# Patient Record
Sex: Male | Born: 1980 | Race: Black or African American | Hispanic: No | Marital: Single | Smoking: Current every day smoker
Health system: Southern US, Community
[De-identification: ages and names within clinical notes are randomized; demographics above are authoritative.]

## PROBLEM LIST (undated history)

## (undated) HISTORY — PX: APPENDECTOMY: SHX54

---

## 2003-11-22 ENCOUNTER — Emergency Department (HOSPITAL_COMMUNITY): Admission: EM | Admit: 2003-11-22 | Discharge: 2003-11-22 | Payer: Self-pay | Admitting: Emergency Medicine

## 2004-10-28 ENCOUNTER — Emergency Department (HOSPITAL_COMMUNITY): Admission: EM | Admit: 2004-10-28 | Discharge: 2004-10-28 | Payer: Self-pay | Admitting: Emergency Medicine

## 2005-12-04 ENCOUNTER — Emergency Department (HOSPITAL_COMMUNITY): Admission: EM | Admit: 2005-12-04 | Discharge: 2005-12-04 | Payer: Self-pay | Admitting: Emergency Medicine

## 2006-05-28 ENCOUNTER — Observation Stay (HOSPITAL_COMMUNITY): Admission: EM | Admit: 2006-05-28 | Discharge: 2006-05-28 | Payer: Self-pay | Admitting: Emergency Medicine

## 2006-08-16 ENCOUNTER — Inpatient Hospital Stay (HOSPITAL_COMMUNITY): Admission: EM | Admit: 2006-08-16 | Discharge: 2006-08-20 | Payer: Self-pay | Admitting: Emergency Medicine

## 2006-11-01 ENCOUNTER — Emergency Department (HOSPITAL_COMMUNITY): Admission: EM | Admit: 2006-11-01 | Discharge: 2006-11-01 | Payer: Self-pay | Admitting: Emergency Medicine

## 2006-12-28 ENCOUNTER — Inpatient Hospital Stay (HOSPITAL_COMMUNITY): Admission: EM | Admit: 2006-12-28 | Discharge: 2007-01-01 | Payer: Self-pay | Admitting: Emergency Medicine

## 2007-03-28 ENCOUNTER — Emergency Department (HOSPITAL_COMMUNITY): Admission: EM | Admit: 2007-03-28 | Discharge: 2007-03-29 | Payer: Self-pay | Admitting: Emergency Medicine

## 2009-07-20 ENCOUNTER — Ambulatory Visit: Payer: Self-pay | Admitting: Cardiothoracic Surgery

## 2009-07-20 ENCOUNTER — Inpatient Hospital Stay (HOSPITAL_COMMUNITY): Admission: EM | Admit: 2009-07-20 | Discharge: 2009-07-24 | Payer: Self-pay | Admitting: Cardiothoracic Surgery

## 2009-07-20 ENCOUNTER — Encounter: Payer: Self-pay | Admitting: Emergency Medicine

## 2010-10-26 ENCOUNTER — Encounter: Payer: Self-pay | Admitting: Cardiothoracic Surgery

## 2011-01-08 LAB — DIFFERENTIAL
Basophils Relative: 1 % (ref 0–1)
Lymphs Abs: 2.2 10*3/uL (ref 0.7–4.0)
Monocytes Relative: 6 % (ref 3–12)
Neutro Abs: 4.8 10*3/uL (ref 1.7–7.7)
Neutrophils Relative %: 62 % (ref 43–77)

## 2011-01-08 LAB — BASIC METABOLIC PANEL
BUN: 13 mg/dL (ref 6–23)
Calcium: 8.8 mg/dL (ref 8.4–10.5)
Creatinine, Ser: 1.05 mg/dL (ref 0.4–1.5)
GFR calc Af Amer: >60 mL/min (ref >60)

## 2011-01-08 LAB — CBC
MCHC: 34.7 g/dL (ref 30.0–36.0)
Platelets: 202 10*3/uL (ref 150–400)
RBC: 5.03 MIL/uL (ref 4.22–5.81)
WBC: 7.8 10*3/uL (ref 4.0–10.5)

## 2011-01-08 LAB — D-DIMER, QUANTITATIVE: D-Dimer, Quant: 0.32 ug/mL-FEU (ref 0.00–0.48)
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
## 2011-02-20 NOTE — H&P (Signed)
NAME:  Oscar Acosta, Oscar Acosta NO.:  [id number]   MEDICAL RECORD NO.:  [id number]          PATIENT TYPE:  INP   LOCATION:  1319                         FACILITY:  [facility]   PHYSICIAN:  Herbie Saxon, MDDATE OF BIRTH:  November 07, 1980   DATE OF ADMISSION:  12/27/2006  DATE OF DISCHARGE:                              HISTORY & PHYSICAL   CHIEF COMPLAINT:  This is a 30 year old male with the chief complaint of  a sore throat and fever for three days.   PRIMARY CARE PHYSICIAN:  Unassigned.   HISTORY OF PRESENT ILLNESS:  This 30 year old African/American male  presenting with right neck pain, pain on swallowing with a sore throat  and high-grade fever, temperature of 101 degrees, difficulty with  swallowing and pain on swallowing.  He has also noticed right ear pain  and headache, though no neck stiffness or seizure activity.   PAST MEDICAL HISTORY:  He had a lung collapse in September 2007.   PAST SURGICAL HISTORY:  Chest tube inserted in September 2007.   FAMILY HISTORY:  Grandmother has cancer of the breast.   MEDICATIONS:  None.   ALLERGIES:  No known drug allergies.   SOCIAL HISTORY:  He smokes 1/2 pack per day.  He drinks questionable  alcohol.  He is single.  He works as an Event organiser.  He has  no children.   REVIEW OF SYSTEMS:  The total systems were reviewed and are positive as  above.   PHYSICAL EXAMINATION:  GENERAL:  He is a young man, not in acute  distress.  VITAL SIGNS:  Present temperature 98 degrees, pulse 80, respirations 20,  blood pressure 130/80.  HEENT:  Pupils equal, reactive to light and accommodation.  A frontal  swelling on the right side of his neck.  The left eardrum is  erythematous.  Right eardrum is erythematous (inflamed), with enlarged  tonsils bilaterally, right greater than left.  No reduced auditory  acuity.  CHEST:  Clinically clear.  HEART:  Sounds 1 and 2 regular.  ABDOMEN:  Soft, nontender.  NEUROLOGIC:  He  is alert and oriented x3.  EXTREMITIES:  Peripheral pulses present.  No pedal edema.   LABORATORY DATA:  WBCs 7.5,  hematocrit 30, platelet count 275, glucose  100.  Sodium 136, potassium 3.7, chloride 105, bicarbonate 26, BUN 4,  creatinine 0.8.   CT of neck shows a peritonsillar abscess.   PLAN:  The patient is to be admitted to the medical floor.  An ENT  consultation for possible I&D.  IV Unasyn 1.5 gram q.6h., IV clindamycin  600 mg q.8h., Motrin 800 mg q.8h. p.r.n., Tylenol 650 mg q.6h. p.r.n.,  IV fluid with normal saline at 250 an hour, Dilaudid 1 mg IV q.6h.  p.r.n., a clear liquid diet and advance as tolerated. Lovenox 40 mg  subcu daily, Protonix 40 mg IV daily.   CONDITION:  Fair.   PROGNOSIS:  Fair.  The patient has been explained and verbalizes  understanding.   The time of admission is 40 minutes.      Herbie Saxon, MD  Electronically Signed  MIO/MEDQ  D:  12/28/2006  T:  12/28/2006  Job:  [id number]

## 2011-02-20 NOTE — Discharge Summary (Signed)
NAME:  Oscar Acosta, Oscar Acosta NO.:  [id number]   MEDICAL RECORD NO.:  [id number]          PATIENT TYPE:  INP   LOCATION:  1319                         [facility]ACILITY[facility]  [facility]   PHYSICIAN:  Herbie Saxon, MDDATE OF BIRTH:  1981/05/03   DATE OF ADMISSION:  12/27/2006  DATE OF DISCHARGE:  01/01/2007                               DISCHARGE SUMMARY   DISCHARGE DIAGNOSIS:  Peritonsillar phlegmon.   CONSULTS:  Newman Pies, M.D., ENT.   RADIOLOGY:  The CAT scan of the neck on December 28, 2006 shows soft  tissues on the right tonsillar pillar, a 2 cm fluid collection in the  right floor of the mouth just superior to the submandibular salivary  gland, consistent with a peritonsillar abscess.   HOSPITAL COURSE:  This 30 year old African-American male presented with  right-sided neck pain, pain on swallowing, sore throat, high grade  fever, inability to open mouth properly, difficulty with swallowing and  pain on swallowing, associated right ear pain and headache.  ENT was  consulted, Dr. Suszanne Conners.  Agreed with IV antibiotic regimen Clindamycin.  Vancomycin and Avelox were later ordered.  Patient is clinically  improved.  He is afebrile, and he has been cleared for discharge by the  ENT surgeon.   DISCHARGE CONDITION:  Stable.   DIET:  Regular.   ACTIVITY:  No restriction.   Follow up with the ENT consultant, Dr. Suszanne Conners, in one week.   DISPOSITION:  Home.   MEDICATIONS ON DISCHARGE:  1. Augmentin 875 mg p.o. b.i.d. x5 days.  2. Clindamycin 600 mg p.o. t.i.d. x5 days.  3. Motrin 800 mg p.o. q.8h. p.r.n.  4. Levoxyl 500 mg p.o. t.i.d. x3 days.  5. Ambien 5 mg p.o. nightly p.r.n.  6. Vicodin 5/500 1 q.6h. p.r.n.   Patient is to follow up with the primary care of his choice in a week or  two, as he presently does not have any primary care.   PHYSICAL EXAMINATION:  GENERAL:  He is a young man in no acute distress.  VITAL SIGNS:  Temperature 98, pulse 70, respiratory rate 20,  blood  pressure 110/70.  HEENT:  Right facial swelling is much improved, minimal tenderness,  reduced trismus.  Significantly better.  He is tolerating oral feeds.  HEART:  S1 and S2.  ABDOMEN:  Benign.  He is alert and oriented x3.  EXTREMITIES:  Peripheral pulses present.  No pedal edema.  NEUROLOGIC:  Cranial nerves I-XII grossly normal.  Deep tendon reflexes  2+ generally.   LABS:  WBC is 7, hematocrit 36, platelet count 245.  Chemistry shows  sodium 113, potassium 3.9, chloride 105, bicarbonate 25, glucose 116,  BUN 5, creatinine 0.8.  Blood culture is negative so far.  Strep group A  screen was negative.   TIME:  Thirty minutes.      Herbie Saxon, MD  Electronically Signed     MIO/MEDQ  D:  01/01/2007  T:  01/01/2007  Job:  [id number]

## 2011-02-20 NOTE — Op Note (Signed)
NAME:  Oscar Acosta, Oscar Acosta NO.:  [id number]   MEDICAL RECORD NO.:  [id number]          PATIENT TYPE:  OBV   LOCATION:  2550                         FACILITY:  [facility]   PHYSICIAN:  Marcelo Baldy, DDS, MDDATE OF BIRTH:  1981-03-26   DATE OF PROCEDURE:  05/28/2006  DATE OF DISCHARGE:  05/28/2006                                 OPERATIVE REPORT   PREOPERATIVE DIAGNOSES:  1. A 3-cm complicated left upper eyelid laceration.  2. A 10-cm complicated right cheek laceration.  3. A 12-cm complicated right cheek laceration which included the vermilion      border of the lip.  4. Multiple, less than 1-cm, superficial facial lacerations.  5. Foreign body and left upper eyelid laceration.  6. A 2-cm right brow laceration.   POSTOPERATIVE DIAGNOSES:  1. A 3-cm complicated left upper eyelid laceration.  2. A 10-cm complicated right cheek laceration.  3. A 12-cm complicated right cheek laceration which included the vermilion      border of the lip.  4. Multiple less than 1-cm superficial facial lacerations.  5. Foreign body and left upper eyelid laceration.  6. A 2-cm right brow laceration.   PROCEDURES PERFORMED:  1. Irrigation and debridement of multiple facial lacerations with removal      of necrotic skin edges.  2. Removal of foreign body from left upper eyelid.  3. Complex Repair of multiple facial lacerations.   ANESTHESIA:  General endotracheal anesthesia.   COMPLICATIONS:  None.   SPECIMENS:  None.   ESTIMATED BLOOD LOSS:  25 mL   PROCEDURE IN DETAIL:  The patient was brought to the operating room, placed  on the operating room table in the supine position.  Leads and monitors were  placed by the anesthesia team in the standard fashion.  The patient was then  induced and intubated.  Bilateral breath sounds and end tidal CO2 confirmed  accurate tube placement.  The patient's face was then prepped and draped in  the appropriate sterile fashion.   Attention  was first turned to the cheek lacerations both of which were pulse  vac'd with approximately 250 mL of saline to clean the wounds.  Bipolar  cautery was used for hemostasis.  The lacerations were noted to extend  through the skin and muscle of the cheek.  The inferior cheek laceration  which is approximately 12 cm in length extended through the vermilion border  of the lower lip near the commissure.  This laceration also extended through  the skin and the muscle did not penetrate the periosteum overlying the  mandible.  The other lacerations of the face were irrigated gently with bulb  irrigation.  Several pieces of glass were removed from the left upper eyelid  laceration.  The right and left eyes were also gently irrigated using  balanced salt solution, and again small pieces of glass were removed from  the fornix of the left eye.  After all of the lacerations were thoroughly  irrigated, necrotic skin edges were then debrided and the wound margins were  freshened to allow for excellent approximation at the  time of closure.   Attention was first turned to reapproximating the vermilion border of the  lip.  This was done by placing a 5-0 Monocryl in the muscle layer and then a  5-0 plain gut suture in the mucosal layer.  The remainder of that laceration  was closed in layers using 4-0 Monocryl in an interrupted fashion in the  deep layers and 6-0 nylon in a running baseball fashion in the skin.  The  second right cheek laceration, after hemostasis was obtained with bipolar  cautery, was also closed in a similar fashion using 4-0 Monocryl in an  interrupted fashion in the deep layers and 6-0 nylon in a running baseball  fashion in the skin.  The right brow laceration edges were freshened and  necrotic skin was debrided.  The skin was undermined in the subcutaneous  plane to allow for a passive closure.  The deep layers were then closed with  4-0 Monocryl in an interrupted fashion.  The skin  was closed with 6-0 nylon  in a running baseball fashion.  Prior to closure of the right cheek  lacerations, the skin was also slightly undermined approximately 1 cm in all  directions to allow for a tension-free closure.   The left upper eyelid, after thorough irrigation and debridement, removal of  foreign bodies was a very jagged laceration, so the edges were freshened to  create smooth contours of the skin.  The laceration was then closed using 6-  0 nylon in an interrupted and running baseball fashion.   The other multiple superficial lacerations of the face were closed in single  layer using 6-0 nylon and 6-0 Prolene in an interrupted fashion.  After all  the lacerations were closed, the face was gently cleansed.  The lacerations  were then dressed with bacitracin ointment.  Patient at this point was  allowed to awaken from anesthesia, was extubated, and was transported to the  recovery room in stable condition.  The patient tolerated the procedure  well.  At the end of the procedure, all needle and sponge counts were  correct x2.  There were no complications for the procedure.      Marcelo Baldy, DDS, MD  Electronically Signed     TGB/MEDQ  D:  05/30/2006  T:  05/31/2006  Job:  [id number]

## 2011-02-20 NOTE — Discharge Summary (Signed)
NAME:  Oscar Acosta, Oscar Acosta NO.:  [id number]   MEDICAL RECORD NO.:  [id number]          PATIENT TYPE:  INP   LOCATION:  2007                         FACILITY:  [facility]   PHYSICIAN:  Kerin Perna, M.D.  DATE OF BIRTH:  01-07-1981   DATE OF ADMISSION:  08/15/2006  DATE OF DISCHARGE:  08/20/2006                                 DISCHARGE SUMMARY   DISCHARGE DATE:  Tentatively 1 to 2 days.   ADMISSION DIAGNOSIS:  Spontaneous right pneumothorax.   DISCHARGE DIAGNOSIS:  Right spontaneous pneumothorax, status post chest tube  placement.   CONSULTS:  1. August 16, 2006, Dr. Kathlee Nations Trigt was consulted for cardiothoracic      surgery.  2. August 16, 2006, tobacco cessation was consulted.   PROCEDURES:  August 16, 2006, patient underwent a right chest tube  (pigtail catheter) by radiology.   HISTORY OF PRESENT ILLNESS:  This is [name] who  was in his usual state of health until 10 p.m. on August 15, 2006 when he  developed a sudden onset of right-sided chest pain after getting up from his  chair after working on his computer.  The pain was constant and associated  with shortness of breath.  The pain was sharp.  Due to his chest pain and  shortness of breath, he presented to the [facility] Emergency  Department.  Please see dictated history and physical for further details.   HOSPITAL COURSE:  Upon presentation to the emergency department, a chest x-  ray was done, which showed a complete right pneumothorax.  The patient also  had cardiac enzymes and D-dimer, which were negative.  Chest x-ray showed  right spontaneous pneumothorax, first occurrence.  Dr. Donata Clay was  consulted; however, he was in the operating room and, therefore, a right  chest tube, pigtail catheter, was placed by radiology.  Followup chest x-ray  showed reexpansion in the lungs with residual 5 to 10% right pneumothorax.  Patient's pleural evac was  placed to suction and it was monitored with daily  chest x-rays.  Tobacco cessation spoke with the patient on August 16, 2006.   Patient did have a small air leak present after chest tube placement.  The  plan was to wait for 1 to 2 days to see if his lung reexpanded or he may  need VATS.  August 17, 2006, the patient's lung dropped with a decrease in  suction.  CT scan was scheduled for August 18, 2006.  Patient was then  again put on wall suction at 20 cm without an air leak at 1500 on August 17, 2006.  Repeat chest x-ray revealed lung reexpansion at 90%.  On August 18, 2006, patient was put on water seal and followup chest x-ray showed no  significant change, less than 5% pneumothorax.  August 19, 2006, patient's  chest x-ray was stable, left tiny pneumothorax and his chest tube was DC.  Followup chest x-ray is still pending.  Patient did not have an air leak on  November 14 and August 19, 2006.  His chest tube had minimal drainage.  Patient was mobilizing in the halls with a steady gait and doing his  incentive spirometry as directed.  The patient had IV morphine and Tylox  p.r.n. for pain control.   DISCHARGE DISPOSITION:  Patient will be discharged home in the next 1 to 2  days provided he remains hemodynamically stable, his pain is controlled and  his chest x-rays are stable.   MEDICATIONS:  Include Tylox one to two tabs every 4 hours p.r.n.   INSTRUCTIONS:  Patient instructed to follow with a regular diet.  No driving  or heavy lifting greater than 10 pounds for 2 weeks.  The patient _________  and to increase activity as tolerated.  He is continue his breathing  exercises.  He may shower and clean his incisions with mild soap and water.  Patient is to call the office if any wound problems shall arise of his  incision, erythema, drainage, temp greater than 101.5 or an increase in  shortness of breath.   FOLLOWUP:  Patient will have a followup appointment with  Dr. Donata Clay in 2  weeks.  The office will contact him with time and date of appointment.  Prior to seeing Dr. Donata Clay, the patient will have a chest x-ray taken at  [facility].      Constance Holster, [place]      Kerin Perna, M.D.  Electronically Signed    JMW/MEDQ  D:  08/19/2006  T:  08/20/2006  Job:  [id number]

## 2011-05-11 ENCOUNTER — Encounter: Payer: Self-pay | Admitting: *Deleted

## 2011-05-11 ENCOUNTER — Emergency Department (HOSPITAL_BASED_OUTPATIENT_CLINIC_OR_DEPARTMENT_OTHER)
Admission: EM | Admit: 2011-05-11 | Discharge: 2011-05-11 | Disposition: A | Discharge status: Home / Self Care | Payer: Self-pay | Attending: Emergency Medicine | Admitting: Emergency Medicine

## 2011-05-11 DIAGNOSIS — R197 Diarrhea, unspecified: Secondary | ICD-10-CM | POA: Insufficient documentation

## 2011-05-11 DIAGNOSIS — R1110 Vomiting, unspecified: Secondary | ICD-10-CM | POA: Insufficient documentation

## 2011-05-11 DIAGNOSIS — R109 Unspecified abdominal pain: Secondary | ICD-10-CM | POA: Insufficient documentation

## 2011-05-11 LAB — URINE MICROSCOPIC-ADD ON

## 2011-05-11 LAB — COMPREHENSIVE METABOLIC PANEL WITH GFR
ALT: 87 U/L — ABNORMAL HIGH (ref 0–53)
AST: 45 U/L — ABNORMAL HIGH (ref 0–37)
Albumin: 4.3 g/dL (ref 3.5–5.2)
Alkaline Phosphatase: 90 U/L (ref 39–117)
BUN: 11 mg/dL (ref 6–23)
CO2: 24 meq/L (ref 19–32)
Calcium: 9.4 mg/dL (ref 8.4–10.5)
Chloride: 101 meq/L (ref 96–112)
Creatinine, Ser: 0.9 mg/dL (ref 0.50–1.35)
GFR calc Af Amer: >60 mL/min
GFR calc non Af Amer: >60 mL/min
Glucose, Bld: 102 mg/dL — ABNORMAL HIGH (ref 70–99)
Potassium: 3.9 meq/L (ref 3.5–5.1)
Sodium: 138 meq/L (ref 135–145)
Total Bilirubin: 0.5 mg/dL (ref 0.3–1.2)
Total Protein: 8.4 g/dL — ABNORMAL HIGH (ref 6.0–8.3)

## 2011-05-11 LAB — URINALYSIS, ROUTINE W REFLEX MICROSCOPIC
Glucose, UA: NEGATIVE mg/dL
Ketones, ur: NEGATIVE mg/dL
Leukocytes, UA: NEGATIVE
Nitrite: NEGATIVE
Protein, ur: 30 mg/dL — AB

## 2011-05-11 LAB — CBC
HCT: 44.9 % (ref 39.0–52.0)
Hemoglobin: 16.5 g/dL (ref 13.0–17.0)
MCH: 28.7 pg (ref 26.0–34.0)
MCHC: 36.7 g/dL — ABNORMAL HIGH (ref 30.0–36.0)
MCV: 78.2 fL (ref 78.0–100.0)
Platelets: 183 K/uL (ref 150–400)
RBC: 5.74 MIL/uL (ref 4.22–5.81)
RDW: 13.4 % (ref 11.5–15.5)
WBC: 8 K/uL (ref 4.0–10.5)

## 2011-05-11 LAB — DIFFERENTIAL
Basophils Absolute: 0 10*3/uL (ref 0.0–0.1)
Basophils Relative: 0 % (ref 0–1)
Lymphocytes Relative: 5 % — ABNORMAL LOW (ref 12–46)
Monocytes Absolute: 0.4 10*3/uL (ref 0.1–1.0)
Neutro Abs: 7.2 10*3/uL (ref 1.7–7.7)
Neutrophils Relative %: 90 % — ABNORMAL HIGH (ref 43–77)

## 2011-05-11 LAB — LIPASE, BLOOD: Lipase: 18 U/L (ref 11–59)

## 2011-05-11 MED ORDER — ONDANSETRON HCL 4 MG PO TABS: 4.0000 mg | ORAL_TABLET | Freq: Four times a day (QID) | ORAL | Status: AC

## 2011-05-11 MED ORDER — SODIUM CHLORIDE 0.9 % IV SOLN
999.0000 mL | Freq: Once | INTRAVENOUS | Status: AC
  Administered 2011-05-11: 1000 mL via INTRAVENOUS

## 2011-05-11 MED ORDER — ACETAMINOPHEN 500 MG PO TABS
1000.0000 mg | ORAL_TABLET | Freq: Once | ORAL | Status: AC
  Administered 2011-05-11: 1000 mg via ORAL
  Filled 2011-05-11: qty 2

## 2011-05-11 MED ORDER — ONDANSETRON HCL 4 MG/2ML IJ SOLN
4.0000 mg | Freq: Once | INTRAMUSCULAR | Status: AC
  Administered 2011-05-11: 4 mg via INTRAVENOUS
  Filled 2011-05-11: qty 2

## 2011-05-11 NOTE — ED Notes (Signed)
Woke at 3 am with fever chills vomiting and diarrhea.

## 2011-05-11 NOTE — ED Provider Notes (Signed)
History     CSN: [id number] Arrival date & time: 05/11/2011  5:46 PM  Chief Complaint  Patient presents with  . Abdominal Pain   Patient is a 30 y.o. male presenting with abdominal pain. The history is provided by the patient and the spouse.  Abdominal Pain The primary symptoms of the illness include abdominal pain, nausea, vomiting and diarrhea. The current episode started 6 to 12 hours ago. The onset of the illness was sudden. The problem has been gradually worsening.  The abdominal pain began 13 to24 hours ago. The abdominal pain has been gradually worsening since its onset. The abdominal pain is generalized. The abdominal pain is relieved by nothing. The abdominal pain is exacerbated by vomiting.  The illness is associated with eating and a recent illness. The patient has had a change in bowel habit. Additional symptoms associated with the illness include chills and back pain. Significant associated medical issues do not include PUD, GERD, gallstones or liver disease.   Patient complains of multiple episodes of vomiting and diarrhea. He has had body aches all day. Patient complains of body aches being worse on the right side of his body. Patient complains of pain in the upper right and left upper abdomen. Patient has not been able to keep down any fluids. Last episode of diarrhea was prior to arrival. History reviewed. No pertinent past medical history.  Past Surgical History  Procedure Date  . Appendectomy     No family history on file.  History  Substance Use Topics  . Smoking status: Current Everyday Smoker -- 0.5 packs/day  . Smokeless tobacco: Not on file  . Alcohol Use: Yes      Review of Systems  Constitutional: Positive for chills.  Gastrointestinal: Positive for nausea, vomiting, abdominal pain and diarrhea.  Musculoskeletal: Positive for back pain.  All other systems reviewed and are negative.    Physical Exam  BP 121/63  Pulse 85  Temp(Src) 102.2 F (39 C)  (Oral)  Resp 20  SpO2 100%  Physical Exam  Constitutional: He is oriented to person, place, and time. He appears well-developed and well-nourished.  HENT:  Head: Normocephalic and atraumatic.  Eyes: Pupils are equal, round, and reactive to light.  Neck: Normal range of motion.  Pulmonary/Chest: Effort normal.  Abdominal: Soft.  Musculoskeletal: Normal range of motion.  Neurological: He is alert and oriented to person, place, and time.  Skin: Skin is warm and dry.  Psychiatric: He has a normal mood and affect.    ED Course  Procedures  MDM  Pt given Iv fluids and zofran.  Labs normal,     Results for orders placed during the hospital encounter of 05/11/11  URINALYSIS, ROUTINE W REFLEX MICROSCOPIC      Component Value Range   Color, Urine YELLOW  YELLOW    Appearance CLEAR  CLEAR    Specific Gravity, Urine 1.033 (*) 1.005 - 1.030    pH 6.0  5.0 - 8.0    Glucose, UA NEGATIVE  NEGATIVE (mg/dL)   Hgb urine dipstick TRACE (*) NEGATIVE    Bilirubin Urine NEGATIVE  NEGATIVE    Ketones, ur NEGATIVE  NEGATIVE (mg/dL)   Protein, ur 30 (*) NEGATIVE (mg/dL)   Urobilinogen, UA 0.2  0.0 - 1.0 (mg/dL)   Nitrite NEGATIVE  NEGATIVE    Leukocytes, UA NEGATIVE  NEGATIVE   CBC      Component Value Range   WBC 8.0  4.0 - 10.5 (K/uL)   RBC 5.74  4.22 - 5.81 (MIL/uL)   Hemoglobin 16.5  13.0 - 17.0 (g/dL)   HCT 40.9  81.1 - 91.4 (%)   MCV 78.2  78.0 - 100.0 (fL)   MCH 28.7  26.0 - 34.0 (pg)   MCHC 36.7 (*) 30.0 - 36.0 (g/dL)   RDW 78.2  95.6 - 21.3 (%)   Platelets 183  150 - 400 (K/uL)  DIFFERENTIAL      Component Value Range   Neutrophils Relative 90 (*) 43 - 77 (%)   Neutro Abs 7.2  1.7 - 7.7 (K/uL)   Lymphocytes Relative 5 (*) 12 - 46 (%)   Lymphs Abs 0.4 (*) 0.7 - 4.0 (K/uL)   Monocytes Relative 5  3 - 12 (%)   Monocytes Absolute 0.4  0.1 - 1.0 (K/uL)   Eosinophils Relative 0  0 - 5 (%)   Eosinophils Absolute 0.0  0.0 - 0.7 (K/uL)   Basophils Relative 0  0 - 1 (%)   Basophils  Absolute 0.0  0.0 - 0.1 (K/uL)  COMPREHENSIVE METABOLIC PANEL      Component Value Range   Sodium 138  135 - 145 (mEq/L)   Potassium 3.9  3.5 - 5.1 (mEq/L)   Chloride 101  96 - 112 (mEq/L)   CO2 24  19 - 32 (mEq/L)   Glucose, Bld 102 (*) 70 - 99 (mg/dL)   BUN 11  6 - 23 (mg/dL)   Creatinine, Ser 0.86  0.50 - 1.35 (mg/dL)   Calcium 9.4  8.4 - 57.8 (mg/dL)   Total Protein 8.4 (*) 6.0 - 8.3 (g/dL)   Albumin 4.3  3.5 - 5.2 (g/dL)   AST 45 (*) 0 - 37 (U/L)   ALT 87 (*) 0 - 53 (U/L)   Alkaline Phosphatase 90  39 - 117 (U/L)   Total Bilirubin 0.5  0.3 - 1.2 (mg/dL)   GFR calc non Af Amer >60  >60 (mL/min)   GFR calc Af Amer >60  >60 (mL/min)  LIPASE, BLOOD      Component Value Range   Lipase 18  11 - 59 (U/L)  URINE MICROSCOPIC-ADD ON      Component Value Range   Squamous Epithelial / LPF RARE  RARE    Bacteria, UA RARE  RARE    Urine-Other MUCOUS PRESENT     No results found.   Langston Masker, [place] 05/11/11 2059  Langston Masker, [place] 05/11/11 2102

## 2011-05-12 NOTE — ED Provider Notes (Signed)
History     CSN: [id number] Arrival date & time: 05/11/2011  5:46 PM  Chief Complaint  Patient presents with  . Abdominal Pain   HPI  History reviewed. No pertinent past medical history.  Past Surgical History  Procedure Date  . Appendectomy     No family history on file.  History  Substance Use Topics  . Smoking status: Current Everyday Smoker -- 0.5 packs/day  . Smokeless tobacco: Not on file  . Alcohol Use: Yes      Review of Systems  Physical Exam  BP 121/63  Pulse 85  Temp(Src) 99.3 F (37.4 C) (Oral)  Resp 20  SpO2 100%  Physical Exam  ED Course  Procedures  Medical screening examination/treatment/procedure(s) were conducted as a shared visit with non-physician practitioner(s) and myself.  I personally evaluated the patient during the encounter.       Forbes Cellar, MD 05/12/11 [phone]

## 2011-07-22 LAB — DIFFERENTIAL
Eosinophils Absolute: 0
Eosinophils Relative: 0
Lymphocytes Relative: 12
Lymphs Abs: 1.3
Monocytes Relative: 7

## 2011-07-22 LAB — RAPID STREP SCREEN (MED CTR MEBANE ONLY): Streptococcus, Group A Screen (Direct): NEGATIVE

## 2011-07-22 LAB — CBC
HCT: 41.3
MCV: 82.3
Platelets: 191
RBC: 5.01
WBC: 10.8 — ABNORMAL HIGH

## 2013-09-06 ENCOUNTER — Encounter (HOSPITAL_BASED_OUTPATIENT_CLINIC_OR_DEPARTMENT_OTHER): Payer: Self-pay | Admitting: Emergency Medicine

## 2013-09-06 ENCOUNTER — Emergency Department (HOSPITAL_BASED_OUTPATIENT_CLINIC_OR_DEPARTMENT_OTHER)
Admission: EM | Admit: 2013-09-06 | Discharge: 2013-09-06 | Disposition: A | Discharge status: Home / Self Care | Payer: Self-pay | Attending: Emergency Medicine | Admitting: Emergency Medicine

## 2013-09-06 DIAGNOSIS — F17200 Nicotine dependence, unspecified, uncomplicated: Secondary | ICD-10-CM | POA: Insufficient documentation

## 2013-09-06 DIAGNOSIS — K089 Disorder of teeth and supporting structures, unspecified: Secondary | ICD-10-CM | POA: Insufficient documentation

## 2013-09-06 DIAGNOSIS — K0889 Other specified disorders of teeth and supporting structures: Secondary | ICD-10-CM

## 2013-09-06 MED ORDER — HYDROCODONE-ACETAMINOPHEN 5-325 MG PO TABS: 2.0000 | ORAL_TABLET | ORAL | Status: DC | PRN

## 2013-09-06 MED ORDER — AMOXICILLIN 500 MG PO CAPS: 500.0000 mg | ORAL_CAPSULE | Freq: Three times a day (TID) | ORAL | Status: AC

## 2013-09-06 NOTE — ED Provider Notes (Signed)
CSN: [id number]     Arrival date & time 09/06/13  1545 History   First MD Initiated Contact with Patient 09/06/13 1608     Chief Complaint  Patient presents with  . Dental Pain   (Consider location/radiation/quality/duration/timing/severity/associated sxs/prior Treatment) Patient is a 32 y.o. male presenting with tooth pain. The history is provided by the patient. No language interpreter was used.  Dental Pain Location:  Lower Quality:  No pain Severity:  Moderate Onset quality:  Gradual Timing:  Constant Progression:  Worsening Chronicity:  New Relieved by:  Nothing Worsened by:  Nothing tried Ineffective treatments:  None tried   History reviewed. No pertinent past medical history. Past Surgical History  Procedure Laterality Date  . Appendectomy     History reviewed. No pertinent family history. History  Substance Use Topics  . Smoking status: Current Every Day Smoker -- 0.50 packs/day    Types: Cigarettes  . Smokeless tobacco: Not on file  . Alcohol Use: No    Review of Systems  HENT: Positive for dental problem.   All other systems reviewed and are negative.    Allergies  Review of patient's allergies indicates no known allergies.  Home Medications   Current Outpatient Rx  Name  Route  Sig  Dispense  Refill  . amoxicillin (AMOXIL) 500 MG capsule   Oral   Take 1 capsule (500 mg total) by mouth 3 (three) times daily.   30 capsule   0   . HYDROcodone-acetaminophen (NORCO/VICODIN) 5-325 MG per tablet   Oral   Take 2 tablets by mouth every 4 (four) hours as needed.   20 tablet   0    BP 149/91  Pulse 76  Temp(Src) 99 F (37.2 C) (Oral)  Resp 16  Ht 6\' 2"  (1.88 m)  Wt 200 lb (90.719 kg)  BMI 25.67 kg/m2  SpO2 100% Physical Exam  Nursing note and vitals reviewed. Constitutional: He appears well-developed and well-nourished.  HENT:  Head: Normocephalic and atraumatic.  Right Ear: External ear normal.  Left Ear: External ear normal.   Mouth/Throat: Oropharynx is clear and moist.  Eyes: Pupils are equal, round, and reactive to light.  Neck: Normal range of motion.  Cardiovascular: Normal rate and regular rhythm.   Pulmonary/Chest: Effort normal and breath sounds normal.  Musculoskeletal: Normal range of motion.  Neurological: He is alert.  Skin: Skin is warm.    ED Course  Procedures (including critical care time) Labs Review Labs Reviewed - No data to display Imaging Review No results found.  EKG Interpretation   None       MDM   1. Toothache    Hydrocodone and amoxicillian,   Follow up with Dr. Lucky Cowboy dentist for evaluation    Elson Areas, PA-C 09/06/13 1630

## 2013-09-06 NOTE — ED Provider Notes (Signed)
Medical screening examination/treatment/procedure(s) were performed by non-physician practitioner and as supervising physician I was immediately available for consultation/collaboration.  EKG Interpretation   None         Wynston Romey, MD 09/06/13 2345 

## 2013-09-06 NOTE — ED Notes (Signed)
Pt c/o dental pain x 3 days.

## 2013-12-31 ENCOUNTER — Emergency Department (HOSPITAL_BASED_OUTPATIENT_CLINIC_OR_DEPARTMENT_OTHER): Payer: Self-pay

## 2013-12-31 ENCOUNTER — Emergency Department (HOSPITAL_BASED_OUTPATIENT_CLINIC_OR_DEPARTMENT_OTHER)
Admission: EM | Admit: 2013-12-31 | Discharge: 2013-12-31 | Disposition: A | Discharge status: Home / Self Care | Payer: Self-pay | Attending: Emergency Medicine | Admitting: Emergency Medicine

## 2013-12-31 ENCOUNTER — Encounter (HOSPITAL_BASED_OUTPATIENT_CLINIC_OR_DEPARTMENT_OTHER): Payer: Self-pay | Admitting: Emergency Medicine

## 2013-12-31 DIAGNOSIS — S8990XA Unspecified injury of unspecified lower leg, initial encounter: Secondary | ICD-10-CM | POA: Insufficient documentation

## 2013-12-31 DIAGNOSIS — R000 Tachycardia, unspecified: Secondary | ICD-10-CM | POA: Insufficient documentation

## 2013-12-31 DIAGNOSIS — X500XXA Overexertion from strenuous movement or load, initial encounter: Secondary | ICD-10-CM | POA: Insufficient documentation

## 2013-12-31 DIAGNOSIS — Y9239 Other specified sports and athletic area as the place of occurrence of the external cause: Secondary | ICD-10-CM | POA: Insufficient documentation

## 2013-12-31 DIAGNOSIS — S99929A Unspecified injury of unspecified foot, initial encounter: Principal | ICD-10-CM

## 2013-12-31 DIAGNOSIS — S99919A Unspecified injury of unspecified ankle, initial encounter: Principal | ICD-10-CM

## 2013-12-31 DIAGNOSIS — Y92838 Other recreation area as the place of occurrence of the external cause: Secondary | ICD-10-CM

## 2013-12-31 DIAGNOSIS — F17200 Nicotine dependence, unspecified, uncomplicated: Secondary | ICD-10-CM | POA: Insufficient documentation

## 2013-12-31 DIAGNOSIS — M25561 Pain in right knee: Secondary | ICD-10-CM

## 2013-12-31 DIAGNOSIS — Y9367 Activity, basketball: Secondary | ICD-10-CM | POA: Insufficient documentation

## 2013-12-31 MED ORDER — NAPROXEN 500 MG PO TABS: 500.0000 mg | ORAL_TABLET | Freq: Two times a day (BID) | ORAL | Status: DC

## 2013-12-31 MED ORDER — OXYCODONE-ACETAMINOPHEN 5-325 MG PO TABS
1.0000 | ORAL_TABLET | Freq: Once | ORAL | Status: AC
  Administered 2013-12-31: 1 via ORAL
  Filled 2013-12-31: qty 1

## 2013-12-31 MED ORDER — CYCLOBENZAPRINE HCL 10 MG PO TABS
10.0000 mg | ORAL_TABLET | Freq: Once | ORAL | Status: AC
  Administered 2013-12-31: 10 mg via ORAL
  Filled 2013-12-31: qty 1

## 2013-12-31 MED ORDER — OXYCODONE-ACETAMINOPHEN 5-325 MG PO TABS: 1.0000 | ORAL_TABLET | Freq: Four times a day (QID) | ORAL | Status: DC | PRN

## 2013-12-31 NOTE — ED Notes (Signed)
Patient here with right knee pain after twisting same today while playing basketball, reports that he heard a pop, pain with any weightbearing

## 2013-12-31 NOTE — ED Provider Notes (Signed)
Medical screening examination/treatment/procedure(s) were performed by non-physician practitioner and as supervising physician I was immediately available for consultation/collaboration.   EKG Interpretation None        Shaneequa Bahner J. Erlean Mealor, MD 12/31/13 1915 

## 2013-12-31 NOTE — ED Provider Notes (Signed)
CSN: [id number]     Arrival date & time 12/31/13  1454 History   First MD Initiated Contact with Patient 12/31/13 1555     Chief Complaint  Patient presents with  . Knee Pain     (Consider location/radiation/quality/duration/timing/severity/associated sxs/prior Treatment) Patient is a 33 y.o. male presenting with knee pain. The history is provided by the patient.  Knee Pain Location:  Knee Injury: yes   Mechanism of injury comment:  Playing basketball Knee location:  R knee Pain details:    Quality:  Throbbing and shooting   Radiates to:  Does not radiate   Severity:  Severe   Onset quality:  Sudden   Duration:  1 hour   Timing:  Constant Chronicity:  New Dislocation: no   Foreign body present:  No foreign bodies Prior injury to area:  No Relieved by:  None tried Worsened by:  Activity and bearing weight Ineffective treatments:  None tried Associated symptoms: swelling    Oscar ArenaKenneth Countess is a 33 y.o. male who presents to the ED with right knee pain. He was playing basketball and twisted the knee. He heard a pop and felt sudden pain on the lateral aspect. He has pain with any movement of the knee. He denies any other injuries or problems today.   History reviewed. No pertinent past medical history. Past Surgical History  Procedure Laterality Date  . Appendectomy     No family history on file. History  Substance Use Topics  . Smoking status: Current Every Day Smoker -- 0.50 packs/day    Types: Cigarettes  . Smokeless tobacco: Not on file  . Alcohol Use: No    Review of Systems Negative except as stated in HPI   Allergies  Review of patient's allergies indicates no known allergies.  Home Medications  No current outpatient prescriptions on file. BP 131/70  Pulse 106  Temp(Src) 98.9 F (37.2 C)  Resp 18  Wt 209 lb (94.802 kg)  SpO2 99% Physical Exam  Nursing note and vitals reviewed. Constitutional: He is oriented to person, place, and time. He appears  well-developed and well-nourished. No distress.  HENT:  Head: Normocephalic and atraumatic.  Eyes: Conjunctivae and EOM are normal.  Neck: Neck supple.  Cardiovascular: Tachycardia present.   Pulmonary/Chest: Effort normal.  Musculoskeletal:       Right knee: He exhibits decreased range of motion, swelling and effusion. He exhibits no deformity, no laceration, no erythema, normal alignment, normal patellar mobility and no MCL laxity. Tenderness found. LCL tenderness noted.  Neurological: He is alert and oriented to person, place, and time. No cranial nerve deficit.  Skin: Skin is warm and dry.  Psychiatric: He has a normal mood and affect. His behavior is normal.    ED Course  Procedures (including critical care time) Labs Review Labs Reviewed - No data to display Imaging Review Dg Knee Complete 4 Views Right  12/31/2013   CLINICAL DATA:  Twisted right knee.  EXAM: RIGHT KNEE - COMPLETE 4+ VIEW  COMPARISON:  None.  FINDINGS: Negative for a fracture or dislocation. Question a small knee joint effusion. Alignment of the knee is normal. No significant degenerative disease.  IMPRESSION: No acute bone abnormality to the right knee.   Electronically Signed   By: Richarda OverlieAdam  Henn M.D.   On: 12/31/2013 15:28     MDM  33 y.o. male with right knee pain and swelling s/p twisting injury while playing basketball. Knee immobilizer applied, ice, elevate, crutches, pain management and follow up  with ortho.  I have reviewed this patient's vital signs, nurses notes, appropriate imaging and discussed findings with the patient. He voices understanding. Stable for discharge without out signs of compartment syndrome. Neurovascularly intact.    Medication List         naproxen 500 MG tablet  Commonly known as:  NAPROSYN  Take 1 tablet (500 mg total) by mouth 2 (two) times daily.     oxyCODONE-acetaminophen 5-325 MG per tablet  Commonly known as:  ROXICET  Take 1 tablet by mouth every 6 (six) hours as needed  for severe pain.            [place], [place] 12/31/13 [phone]

## 2015-07-17 ENCOUNTER — Emergency Department (HOSPITAL_BASED_OUTPATIENT_CLINIC_OR_DEPARTMENT_OTHER)
Admission: EM | Admit: 2015-07-17 | Discharge: 2015-07-18 | Disposition: A | Discharge status: Home / Self Care | Payer: Self-pay | Attending: Emergency Medicine | Admitting: Emergency Medicine

## 2015-07-17 ENCOUNTER — Encounter (HOSPITAL_BASED_OUTPATIENT_CLINIC_OR_DEPARTMENT_OTHER): Payer: Self-pay | Admitting: Emergency Medicine

## 2015-07-17 DIAGNOSIS — Z791 Long term (current) use of non-steroidal anti-inflammatories (NSAID): Secondary | ICD-10-CM | POA: Insufficient documentation

## 2015-07-17 DIAGNOSIS — M791 Myalgia, unspecified site: Secondary | ICD-10-CM

## 2015-07-17 DIAGNOSIS — J029 Acute pharyngitis, unspecified: Secondary | ICD-10-CM | POA: Insufficient documentation

## 2015-07-17 DIAGNOSIS — M79652 Pain in left thigh: Secondary | ICD-10-CM | POA: Insufficient documentation

## 2015-07-17 DIAGNOSIS — Z720 Tobacco use: Secondary | ICD-10-CM | POA: Insufficient documentation

## 2015-07-17 DIAGNOSIS — M79604 Pain in right leg: Secondary | ICD-10-CM | POA: Insufficient documentation

## 2015-07-17 DIAGNOSIS — M79605 Pain in left leg: Secondary | ICD-10-CM | POA: Insufficient documentation

## 2015-07-17 NOTE — ED Notes (Addendum)
Patient reports that he is having a Headache and Sore throat so bad that it hurts to swollow. Patient also reports that he is having sharp pains down into his leg that have been taking place x 1 week. The patient seems to be mostly concerned with his Headache. Patient also reports that sometimes he is "short on his breath", and has back pain.

## 2015-07-18 LAB — CBC WITH DIFFERENTIAL/PLATELET
BASOS ABS: 0.1 10*3/uL (ref 0.0–0.1)
BASOS PCT: 1 %
EOS PCT: 1 %
Eosinophils Absolute: 0.2 10*3/uL (ref 0.0–0.7)
HCT: 42.7 % (ref 39.0–52.0)
Hemoglobin: 15.1 g/dL (ref 13.0–17.0)
Lymphocytes Relative: 15 %
Lymphs Abs: 1.8 10*3/uL (ref 0.7–4.0)
MCH: 28.1 pg (ref 26.0–34.0)
MCHC: 35.4 g/dL (ref 30.0–36.0)
MCV: 79.4 fL (ref 78.0–100.0)
Monocytes Absolute: 1.1 10*3/uL — ABNORMAL HIGH (ref 0.1–1.0)
Monocytes Relative: 9 %
NEUTROS ABS: 9 10*3/uL — AB (ref 1.7–7.7)
Neutrophils Relative %: 74 %
PLATELETS: 190 10*3/uL (ref 150–400)
RBC: 5.38 MIL/uL (ref 4.22–5.81)
RDW: 13.7 % (ref 11.5–15.5)
WBC: 12.2 10*3/uL — ABNORMAL HIGH (ref 4.0–10.5)

## 2015-07-18 LAB — CK: Total CK: 124 U/L (ref 49–397)

## 2015-07-18 LAB — MONONUCLEOSIS SCREEN: MONO SCREEN: NEGATIVE

## 2015-07-18 LAB — RAPID STREP SCREEN (MED CTR MEBANE ONLY): STREPTOCOCCUS, GROUP A SCREEN (DIRECT): NEGATIVE

## 2015-07-18 MED ORDER — HYDROCODONE-ACETAMINOPHEN 7.5-325 MG/15ML PO SOLN: 10.0000 mL | ORAL | Status: DC | PRN

## 2015-07-18 MED ORDER — HYDROCODONE-ACETAMINOPHEN 7.5-325 MG/15ML PO SOLN
15.0000 mL | Freq: Once | ORAL | Status: AC
Start: 2015-07-18 — End: 2015-07-18
  Administered 2015-07-18: 15 mL via ORAL
  Filled 2015-07-18: qty 15

## 2015-07-18 NOTE — ED Provider Notes (Signed)
CSN: [id number]     Arrival date & time 07/17/15  2346 History   First MD Initiated Contact with Patient 07/18/15 0006     Chief Complaint  Patient presents with  . Sore Throat     (Consider location/radiation/quality/duration/timing/severity/associated sxs/prior Treatment) HPI  This is a [age] year old male with a two-day history of sore throat. He is having moderate to severe pain in his throat, worse when he swallows. There is been associated fever as high as 102. He denies cold symptoms apart from cough which he attributes to his throat discomfort. He is complaining of an associated headache. He states his entire neck is sore. He denies abdominal pain. He has been having pain in his legs for the past 2 weeks. It initially started in his right thigh but is now in his left thigh. He describes the pain as sharp but the pain is localized in the entire musculature of the thigh. This pain is worse with ambulation or movement.  History reviewed. No pertinent past medical history. Past Surgical History  Procedure Laterality Date  . Appendectomy     History reviewed. No pertinent family history. Social History  Substance Use Topics  . Smoking status: Current Every Day Smoker -- 0.50 packs/day    Types: Cigarettes  . Smokeless tobacco: None  . Alcohol Use: No    Review of Systems  All other systems reviewed and are negative.   Allergies  Review of patient's allergies indicates no known allergies.  Home Medications   Prior to Admission medications   Medication Sig Start Date End Date Taking? Authorizing Provider  naproxen (NAPROSYN) 500 MG tablet Take 1 tablet (500 mg total) by mouth 2 (two) times daily. 12/31/13   Hope Orlene OchM Neese, NP  oxyCODONE-acetaminophen (ROXICET) 5-325 MG per tablet Take 1 tablet by mouth every 6 (six) hours as needed for severe pain. 12/31/13   Hope Orlene OchM Neese, NP   BP 126/70 mmHg  Pulse 87  Temp(Src) 98.5 F (36.9 C) (Oral)  Resp 18  Ht 6\' 1"  (1.854 m)  Wt 205  lb (92.987 kg)  BMI 27.05 kg/m2  SpO2 100%   Physical Exam  General: Well-developed, well-nourished male in no acute distress; appearance consistent with age of record HENT: normocephalic; atraumatic; pharyngeal erythema without exudate Eyes: pupils equal, round and reactive to light; extraocular muscles intact Neck: supple; mild posterior and anterior cervical adenopathy Heart: regular rate and rhythm Lungs: clear to auscultation bilaterally Abdomen: soft; nondistended; nontender; no masses or hepatosplenomegaly; bowel sounds present Extremities: No deformity; full range of motion; pulses normal; tenderness of musculature of thighs; no edema Neurologic: Awake, alert and oriented; motor function intact in all extremities and symmetric; no facial droop Skin: Warm and dry Psychiatric: Normal mood and affect    ED Course  Procedures (including critical care time)   MDM  Nursing notes and vitals signs, including pulse oximetry, reviewed.  Summary of this visit's results, reviewed by myself:  Labs:  Results for orders placed or performed during the hospital encounter of 07/17/15 (from the past 24 hour(s))  Rapid strep screen     Status: None   Collection Time: 07/18/15 12:15 AM  Result Value Ref Range   Streptococcus, Group A Screen (Direct) NEGATIVE NEGATIVE  Mononucleosis screen     Status: None   Collection Time: 07/18/15 12:20 AM  Result Value Ref Range   Mono Screen NEGATIVE NEGATIVE  CBC with Differential/Platelet     Status: Abnormal   Collection Time: 07/18/15 12:20 AM  Result Value Ref Range   WBC 12.2 (H) 4.0 - 10.5 K/uL   RBC 5.38 4.22 - 5.81 MIL/uL   Hemoglobin 15.1 13.0 - 17.0 g/dL   HCT 40.9 81.1 - 91.4 %   MCV 79.4 78.0 - 100.0 fL   MCH 28.1 26.0 - 34.0 pg   MCHC 35.4 30.0 - 36.0 g/dL   RDW 78.2 95.6 - 21.3 %   Platelets 190 150 - 400 K/uL   Neutrophils Relative % 74 %   Neutro Abs 9.0 (H) 1.7 - 7.7 K/uL   Lymphocytes Relative 15 %   Lymphs Abs 1.8 0.7 -  4.0 K/uL   Monocytes Relative 9 %   Monocytes Absolute 1.1 (H) 0.1 - 1.0 K/uL   Eosinophils Relative 1 %   Eosinophils Absolute 0.2 0.0 - 0.7 K/uL   Basophils Relative 1 %   Basophils Absolute 0.1 0.0 - 0.1 K/uL  CK     Status: None   Collection Time: 07/18/15 12:20 AM  Result Value Ref Range   Total CK 124 49 - 397 U/L       Paula Libra, MD 07/18/15 0110

## 2015-07-20 LAB — CULTURE, GROUP A STREP: STREP A CULTURE: NEGATIVE

## 2016-11-23 ENCOUNTER — Emergency Department (HOSPITAL_BASED_OUTPATIENT_CLINIC_OR_DEPARTMENT_OTHER)
Admission: EM | Admit: 2016-11-23 | Discharge: 2016-11-23 | Disposition: A | Discharge status: Home / Self Care | Payer: Self-pay | Attending: Emergency Medicine | Admitting: Emergency Medicine

## 2016-11-23 ENCOUNTER — Emergency Department (HOSPITAL_BASED_OUTPATIENT_CLINIC_OR_DEPARTMENT_OTHER): Payer: Self-pay

## 2016-11-23 ENCOUNTER — Encounter (HOSPITAL_BASED_OUTPATIENT_CLINIC_OR_DEPARTMENT_OTHER): Payer: Self-pay | Admitting: *Deleted

## 2016-11-23 DIAGNOSIS — F17210 Nicotine dependence, cigarettes, uncomplicated: Secondary | ICD-10-CM | POA: Insufficient documentation

## 2016-11-23 DIAGNOSIS — M5442 Lumbago with sciatica, left side: Secondary | ICD-10-CM | POA: Insufficient documentation

## 2016-11-23 DIAGNOSIS — M5432 Sciatica, left side: Secondary | ICD-10-CM

## 2016-11-23 MED ORDER — METHOCARBAMOL 500 MG PO TABS: 1000.0000 mg | ORAL_TABLET | Freq: Three times a day (TID) | ORAL | 0 refills | Status: DC | PRN

## 2016-11-23 MED ORDER — METHOCARBAMOL 1000 MG/10ML IJ SOLN
1000.0000 mg | Freq: Once | INTRAMUSCULAR | Status: AC
  Administered 2016-11-23: 1000 mg via INTRAMUSCULAR
  Filled 2016-11-23: qty 10

## 2016-11-23 MED ORDER — HYDROCODONE-ACETAMINOPHEN 5-325 MG PO TABS: 1.0000 | ORAL_TABLET | Freq: Four times a day (QID) | ORAL | 0 refills | Status: DC | PRN

## 2016-11-23 MED ORDER — KETOROLAC TROMETHAMINE 60 MG/2ML IM SOLN
60.0000 mg | Freq: Once | INTRAMUSCULAR | Status: AC
  Administered 2016-11-23: 60 mg via INTRAMUSCULAR
  Filled 2016-11-23: qty 2

## 2016-11-23 MED ORDER — HYDROCODONE-ACETAMINOPHEN 5-325 MG PO TABS
1.0000 | ORAL_TABLET | Freq: Once | ORAL | Status: AC
  Administered 2016-11-23: 1 via ORAL
  Filled 2016-11-23: qty 1

## 2016-11-23 MED ORDER — IBUPROFEN 600 MG PO TABS: 600.0000 mg | ORAL_TABLET | Freq: Four times a day (QID) | ORAL | 0 refills | Status: DC | PRN

## 2016-11-23 NOTE — ED Notes (Signed)
Patient transported to X-ray 

## 2016-11-23 NOTE — ED Notes (Signed)
Pt awaiting for MD to come talk to pt before discharge.

## 2016-11-23 NOTE — ED Triage Notes (Signed)
Pt reports lower back pain x 1 day.  Denies urinary symptoms. Denies known injury.

## 2016-11-23 NOTE — ED Provider Notes (Signed)
[facility] Provider Note   CSN: [id number] Arrival date & time: 11/23/16  1831  By signing my name below, I, Rosario Adie, attest that this documentation has been prepared under the direction and in the presence of Loren Racer, MD. Electronically Signed: Rosario Adie, ED Scribe. 11/23/16. 10:07 PM.    History   Chief Complaint Chief Complaint  Patient presents with  . Back Pain    HPI Oscar Acosta is a 36 y.o. male with no pertinent PMHx, who presents to the Emergency Department complaining of sudden onset, worsening lower back pain starting earlier today. Pt reports that he was at home today when he got up from his couch and felt a sudden, sharp sensation to his lower back and his pain has been present since. No recent trauma, injury, or falls otherwise. No h/o similar pain or other back issues. He notes radiation of his pain posteriorly into his bilateral thighs, left greater than right. His pain is exacerbated with generalized movement, ambulation, and coughing. No noted treatments for his pain were tried prior to coming into the ED. Pt has a PSHx to the abdomen including an appendectomy. Denies urgency, frequency, hematuria, dysuria, difficulty urinating, bowel/bladder incontinence, abdominal pain, numbness, or any other associated symptoms.   The history is provided by the patient. No language interpreter was used.    History reviewed. No pertinent past medical history.  There are no active problems to display for this patient.   Past Surgical History:  Procedure Laterality Date  . APPENDECTOMY         Home Medications    Prior to Admission medications   Medication Sig Start Date End Date Taking? Authorizing Provider  HYDROcodone-acetaminophen (NORCO) 5-325 MG tablet Take 1 tablet by mouth every 6 (six) hours as needed for severe pain. 11/23/16   Loren Racer, MD  ibuprofen (ADVIL,MOTRIN) 600 MG tablet Take 1 tablet (600 mg total) by  mouth every 6 (six) hours as needed. 11/23/16   Loren Racer, MD  methocarbamol (ROBAXIN) 500 MG tablet Take 2 tablets (1,000 mg total) by mouth every 8 (eight) hours as needed for muscle spasms. 11/23/16   Loren Racer, MD    Family History History reviewed. No pertinent family history.  Social History Social History  Substance Use Topics  . Smoking status: Current Every Day Smoker    Packs/day: 0.50    Types: Cigarettes  . Smokeless tobacco: Not on file  . Alcohol use No     Allergies   Patient has no known allergies.   Review of Systems Review of Systems  Constitutional: Negative for chills and fever.  Cardiovascular: Negative for leg swelling.  Gastrointestinal: Negative for abdominal pain, nausea and vomiting.  Genitourinary: Negative for flank pain, frequency and hematuria.  Musculoskeletal: Positive for back pain and myalgias. Negative for joint swelling and neck pain.  Skin: Negative for rash and wound.  Neurological: Negative for dizziness, weakness, light-headedness and numbness.  All other systems reviewed and are negative.    Physical Exam Updated Vital Signs BP 134/64 (BP Location: Right Arm)   Pulse 71   Temp 97.9 F (36.6 C) (Oral)   Resp 16   Ht 6\' 1"  (1.854 m)   Wt 200 lb (90.7 kg)   SpO2 100%   BMI 26.39 kg/m   Physical Exam  Constitutional: He is oriented to person, place, and time. He appears well-developed and well-nourished.  HENT:  Head: Normocephalic and atraumatic.  Eyes: EOM are normal. Pupils are  equal, round, and reactive to light.  Neck: Normal range of motion. Neck supple.  Cardiovascular: Normal rate and regular rhythm.   Pulmonary/Chest: Effort normal and breath sounds normal.  Abdominal: Soft. Bowel sounds are normal. There is no tenderness. There is no rebound and no guarding.  Musculoskeletal: Normal range of motion. He exhibits tenderness. He exhibits no edema.  Tenderness to palpation over the midline inferior lumbar  spine and left lumbar paraspinal muscles. Negative straight leg raise bilaterally. No lower extremity swelling, asymmetry or tenderness. Distal pulses are 2+.  Neurological: He is alert and oriented to person, place, and time.  Patient is alert and oriented x3 with clear, goal oriented speech. Patient has 5/5 motor in all extremities. Sensation is intact to light touch. Patient has a normal gait and walks without assistance.  Skin: Skin is warm and dry. No rash noted. No erythema.  Psychiatric: He has a normal mood and affect. His behavior is normal.  Nursing note and vitals reviewed.    ED Treatments / Results  DIAGNOSTIC STUDIES: Oxygen Saturation is 100% on RA, normal by my interpretation.   COORDINATION OF CARE: 9:54 PM-Discussed next steps with pt. Pt verbalized understanding and is agreeable with the plan.    Labs (all labs ordered are listed, but only abnormal results are displayed) Labs Reviewed - No data to display  EKG  EKG Interpretation None       Radiology Dg Lumbar Spine 2-3 Views  Result Date: 11/23/2016 CLINICAL DATA:  36 y/o  M; acute lower back pain. EXAM: LUMBAR SPINE - 2-3 VIEW COMPARISON:  None. FINDINGS: There is no evidence of lumbar spine fracture. Alignment is normal. Mild loss of height of the L5-S1 intervertebral disc space. Intervertebral disc spaces are otherwise well maintained. IMPRESSION: Mild loss of disc space height at L5-S1. No acute fracture or dislocation. Electronically Signed   By: Mitzi HansenLance  Furusawa-Stratton M.D.   On: 11/23/2016 22:50    Procedures Procedures (including critical care time)  Medications Ordered in ED Medications  ketorolac (TORADOL) injection 60 mg (60 mg Intramuscular Given 11/23/16 2206)  methocarbamol (ROBAXIN) injection 1,000 mg (1,000 mg Intramuscular Given 11/23/16 2205)  HYDROcodone-acetaminophen (NORCO/VICODIN) 5-325 MG per tablet 1 tablet (1 tablet Oral Given 11/23/16 2307)     Initial Impression / Assessment  and Plan / ED Course  I have reviewed the triage vital signs and the nursing notes.  Pertinent labs & imaging results that were available during my care of the patient were reviewed by me and considered in my medical decision making (see chart for details).     Normal neurologic exam. X-ray with evidence of height loss of the L5-S1 intervertebral disc space. We'll treat symptomatically and refer to spine surgery. Return precautions given.  Final Clinical Impressions(s) / ED Diagnoses   Final diagnoses:  Sciatica of left side    New Prescriptions Discharge Medication List as of 11/23/2016 11:04 PM    START taking these medications   Details  HYDROcodone-acetaminophen (NORCO) 5-325 MG tablet Take 1 tablet by mouth every 6 (six) hours as needed for severe pain., Starting Mon 11/23/2016, Print    ibuprofen (ADVIL,MOTRIN) 600 MG tablet Take 1 tablet (600 mg total) by mouth every 6 (six) hours as needed., Starting Mon 11/23/2016, Print    methocarbamol (ROBAXIN) 500 MG tablet Take 2 tablets (1,000 mg total) by mouth every 8 (eight) hours as needed for muscle spasms., Starting Mon 11/23/2016, Print      I personally performed the services described  in this documentation, which was scribed in my presence. The recorded information has been reviewed and is accurate.      Loren Racer, MD 11/24/16 Burna Mortimer

## 2016-11-23 NOTE — ED Notes (Signed)
ED Provider at bedside. 

## 2016-11-25 ENCOUNTER — Telehealth (HOSPITAL_BASED_OUTPATIENT_CLINIC_OR_DEPARTMENT_OTHER): Payer: Self-pay | Admitting: Emergency Medicine

## 2017-07-30 ENCOUNTER — Encounter (HOSPITAL_BASED_OUTPATIENT_CLINIC_OR_DEPARTMENT_OTHER): Payer: Self-pay

## 2017-07-30 ENCOUNTER — Emergency Department (HOSPITAL_BASED_OUTPATIENT_CLINIC_OR_DEPARTMENT_OTHER)
Admission: EM | Admit: 2017-07-30 | Discharge: 2017-07-30 | Disposition: A | Discharge status: Home / Self Care | Payer: No Typology Code available for payment source | Attending: Emergency Medicine | Admitting: Emergency Medicine

## 2017-07-30 ENCOUNTER — Emergency Department (HOSPITAL_BASED_OUTPATIENT_CLINIC_OR_DEPARTMENT_OTHER): Payer: No Typology Code available for payment source

## 2017-07-30 DIAGNOSIS — Y92410 Unspecified street and highway as the place of occurrence of the external cause: Secondary | ICD-10-CM | POA: Insufficient documentation

## 2017-07-30 DIAGNOSIS — Y9389 Activity, other specified: Secondary | ICD-10-CM | POA: Insufficient documentation

## 2017-07-30 DIAGNOSIS — F17210 Nicotine dependence, cigarettes, uncomplicated: Secondary | ICD-10-CM | POA: Insufficient documentation

## 2017-07-30 DIAGNOSIS — S39012A Strain of muscle, fascia and tendon of lower back, initial encounter: Secondary | ICD-10-CM

## 2017-07-30 DIAGNOSIS — S199XXA Unspecified injury of neck, initial encounter: Secondary | ICD-10-CM | POA: Diagnosis present

## 2017-07-30 DIAGNOSIS — Y998 Other external cause status: Secondary | ICD-10-CM | POA: Diagnosis not present

## 2017-07-30 DIAGNOSIS — S161XXA Strain of muscle, fascia and tendon at neck level, initial encounter: Secondary | ICD-10-CM | POA: Insufficient documentation

## 2017-07-30 MED ORDER — NAPROXEN 500 MG PO TABS: 500.0000 mg | ORAL_TABLET | Freq: Two times a day (BID) | ORAL | 0 refills | Status: DC

## 2017-07-30 MED ORDER — CYCLOBENZAPRINE HCL 10 MG PO TABS: 10.0000 mg | ORAL_TABLET | Freq: Two times a day (BID) | ORAL | 0 refills | Status: DC | PRN

## 2017-07-30 NOTE — ED Notes (Signed)
Patient transported to CT 
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
## 2017-12-13 ENCOUNTER — Encounter (HOSPITAL_BASED_OUTPATIENT_CLINIC_OR_DEPARTMENT_OTHER): Payer: Self-pay | Admitting: *Deleted

## 2017-12-13 ENCOUNTER — Other Ambulatory Visit: Payer: Self-pay

## 2017-12-13 ENCOUNTER — Emergency Department (HOSPITAL_BASED_OUTPATIENT_CLINIC_OR_DEPARTMENT_OTHER)
Admission: EM | Admit: 2017-12-13 | Discharge: 2017-12-13 | Disposition: A | Discharge status: Home / Self Care | Payer: Self-pay | Attending: Emergency Medicine | Admitting: Emergency Medicine

## 2017-12-13 DIAGNOSIS — F17210 Nicotine dependence, cigarettes, uncomplicated: Secondary | ICD-10-CM | POA: Insufficient documentation

## 2017-12-13 DIAGNOSIS — R300 Dysuria: Secondary | ICD-10-CM | POA: Insufficient documentation

## 2017-12-13 DIAGNOSIS — B309 Viral conjunctivitis, unspecified: Secondary | ICD-10-CM | POA: Insufficient documentation

## 2017-12-13 DIAGNOSIS — Z79899 Other long term (current) drug therapy: Secondary | ICD-10-CM | POA: Insufficient documentation

## 2017-12-13 LAB — URINALYSIS, ROUTINE W REFLEX MICROSCOPIC
BILIRUBIN URINE: NEGATIVE
Glucose, UA: NEGATIVE mg/dL
Hgb urine dipstick: NEGATIVE
Ketones, ur: NEGATIVE mg/dL
Leukocytes, UA: NEGATIVE
Nitrite: NEGATIVE
PROTEIN: NEGATIVE mg/dL
Specific Gravity, Urine: 1.02 (ref 1.005–1.030)
pH: 7 (ref 5.0–8.0)

## 2017-12-13 MED ORDER — TETRACAINE HCL 0.5 % OP SOLN
2.0000 [drp] | Freq: Once | OPHTHALMIC | Status: AC
  Administered 2017-12-13: 2 [drp] via OPHTHALMIC
  Filled 2017-12-13: qty 4

## 2017-12-13 MED ORDER — FLUORESCEIN SODIUM 1 MG OP STRP
1.0000 | ORAL_STRIP | Freq: Once | OPHTHALMIC | Status: AC
  Administered 2017-12-13: 1 via OPHTHALMIC
  Filled 2017-12-13: qty 1

## 2017-12-13 MED ORDER — NAPHAZOLINE HCL 0.1 % OP SOLN: 1.0000 [drp] | Freq: Four times a day (QID) | OPHTHALMIC | 0 refills | Status: DC | PRN

## 2017-12-13 MED ORDER — PHENAZOPYRIDINE HCL 200 MG PO TABS: 200.0000 mg | ORAL_TABLET | Freq: Three times a day (TID) | ORAL | 0 refills | Status: DC

## 2017-12-13 NOTE — ED Notes (Signed)
ED Provider at bedside. 

## 2017-12-13 NOTE — ED Triage Notes (Signed)
Pain in his right eye since last night. Swelling, itching and drainage this am when he woke. Left lower quadrant pain x 2 days. Painful with urination.

## 2017-12-13 NOTE — ED Provider Notes (Signed)
[facility] EMERGENCY DEPARTMENT Provider Note   CSN: [id number] Arrival date & time: 12/13/17  1509     History   Chief Complaint Chief Complaint  Patient presents with  . Conjunctivitis  . Dysuria    HPI Oscar Acosta is a 37 y.o. male w PMHx appendectomy, presenting to the ED with acute onset of right eye foreign body sensation, itching, and watering since last night.  He states he did not believe he got a foreign body in his eye.  States last night he began feeling a gritty sensation, then woke up this morning with redness, and watering.  He denies significant eye pain, headache, vision changes, or purulent discharge. Patient reports second complaint of dysuria for a few days.  States he has burning with urination, well as left groin/testicular pain with urination. States the groin pain has been intermittent for >1 year and is not new. States he is currently sexually active with male partners.  Denies penile discharge, testicular redness or swelling, nausea vomiting, fever, joint pains, recent illness, flank pain, or pain with defecation.  The history is provided by the patient.    History reviewed. No pertinent past medical history.  There are no active problems to display for this patient.   Past Surgical History:  Procedure Laterality Date  . APPENDECTOMY         Home Medications    Prior to Admission medications   Medication Sig Start Date End Date Taking? Authorizing Provider  cyclobenzaprine (FLEXERIL) 10 MG tablet Take 1 tablet (10 mg total) by mouth 2 (two) times daily as needed for muscle spasms. 07/30/17   Vanetta Mulders, MD  naphazoline (NAPHCON) 0.1 % ophthalmic solution Place 1 drop into the right eye 4 (four) times daily as needed for eye irritation. 12/13/17   Crecencio Kwiatek, [place] N, PA-C  naproxen (NAPROSYN) 500 MG tablet Take 1 tablet (500 mg total) by mouth 2 (two) times daily. 07/30/17   Vanetta Mulders, MD  phenazopyridine (PYRIDIUM) 200  MG tablet Take 1 tablet (200 mg total) by mouth 3 (three) times daily. 12/13/17   Jakobi Thetford, [place] N, PA-C    Family History No family history on file.  Social History Social History   Tobacco Use  . Smoking status: Current Every Day Smoker    Packs/day: 0.50    Types: Cigarettes  . Smokeless tobacco: Never Used  Substance Use Topics  . Alcohol use: No  . Drug use: No     Allergies   Patient has no known allergies.   Review of Systems Review of Systems  Constitutional: Negative for fever.  Eyes: Positive for pain, redness and itching. Negative for photophobia and visual disturbance.  Gastrointestinal: Negative for abdominal pain.  Genitourinary: Positive for dysuria and testicular pain. Negative for discharge, frequency and penile pain.  Neurological: Negative for headaches.     Physical Exam Updated Vital Signs BP 129/90 (BP Location: Left Arm)   Pulse 83   Temp 98.5 F (36.9 C) (Oral)   Resp 18   Ht 6\' 1"  (1.854 m)   Wt 93 kg (205 lb)   SpO2 100%   BMI 27.05 kg/m   Physical Exam  Constitutional: He appears well-developed and well-nourished. No distress.  HENT:  Head: Normocephalic and atraumatic.  Eyes: EOM are normal. Pupils are equal, round, and reactive to light. Lids are everted and swept, no foreign bodies found. Right conjunctiva is injected. Right conjunctiva has no hemorrhage.  Watery discharge. No purulent discharge. Eye visualized under  woods lamp with fluorescein stain; no uptake noted. No evidence of corneal abrasion or foreign body.  Cardiovascular: Normal rate, regular rhythm, normal heart sounds and intact distal pulses.  Pulmonary/Chest: Effort normal and breath sounds normal. No respiratory distress.  Abdominal: Soft. Bowel sounds are normal. He exhibits no distension. There is no tenderness. There is no guarding. Hernia confirmed negative in the left inguinal area.  Genitourinary: Testes normal. Right testis shows no mass, no swelling and no  tenderness. Left testis shows no mass, no swelling and no tenderness. Circumcised. Penile tenderness (shaft) present. No penile erythema. No discharge found.  Genitourinary Comments: Exam performed with male chaperone present.   Musculoskeletal: Normal range of motion.  Lymphadenopathy: No inguinal adenopathy noted on the left side.  Psychiatric: He has a normal mood and affect. His behavior is normal.  Nursing note and vitals reviewed.    ED Treatments / Results  Labs (all labs ordered are listed, but only abnormal results are displayed) Labs Reviewed  URINALYSIS, ROUTINE W REFLEX MICROSCOPIC  GC/CHLAMYDIA PROBE AMP ([place]) NOT AT [facility]    EKG  EKG Interpretation None       Radiology No results found.  Procedures Procedures (including critical care time)  Medications Ordered in ED Medications  fluorescein ophthalmic strip 1 strip (not administered)  tetracaine (PONTOCAINE) 0.5 % ophthalmic solution 2 drop (not administered)     Initial Impression / Assessment and Plan / ED Course  I have reviewed the triage vital signs and the nursing notes.  Pertinent labs & imaging results that were available during my care of the patient were reviewed by me and considered in my medical decision making (see chart for details).     Patient presentation consistent with viral conjunctivitis.  No purulent discharge, corneal abrasions, entrapment, consensual photophobia, or dendritic staining with fluorescein study.  Presentation non-concerning for iritis, bacterial conjunctivitis, corneal abrasions, or HSV.  Pt also with dysuria. GU exam is unremarkable. U/A negative. Gc/Ch culture sent. No arthritis or recent illnesses, doubt Reiters syndrome. No antibiotics are indicated and patient will be prescribed naphazoline for eye itching and pyridium for dysuria.  Personal hygiene and frequent handwashing discussed.  Patient advised to followup with ophthalmologist if symptoms persist or  worsen in any way including vision change or purulent discharge.  Will provide urology referral for urinary sx and groin pain. Patient verbalizes understanding and is agreeable with discharge.  Patient discussed with Dr. Fredderick PhenixBelfi.  Discussed results, findings, treatment and follow up. Patient advised of return precautions. Patient verbalized understanding and agreed with plan.  Final Clinical Impressions(s) / ED Diagnoses   Final diagnoses:  Viral conjunctivitis of right eye  Dysuria    ED Discharge Orders        Ordered    naphazoline (NAPHCON) 0.1 % ophthalmic solution  4 times daily PRN     12/13/17 1833    phenazopyridine (PYRIDIUM) 200 MG tablet  3 times daily     12/13/17 1833       Erian Rosengren, [place] N, PA-C 12/13/17 1837    Rolan BuccoBelfi, Melanie, MD 12/13/17 [phone]

## 2017-12-13 NOTE — Discharge Instructions (Signed)
Apply warm compresses to your right eye. Can apply 1 drop of naphazoline to your right eye every 6 hours as needed for irritation.  You can use saline drops as needed for moisture. It is important to wash her hands frequently, as this is very contagious. Follow up with your ophthalmologist if symptoms persist.  Return to the ER for sudden vision loss, severe eye pain or headache, purulent discharge.   You can take Pyridium every 8 hours as needed for pain with urination.  Be aware this can cause your urine and other secretions to turn orange.  Do not use this medication for more than 2 days. Follow-up with the urologist if your pain persists. You have cultures pending, avoid sexual activity until you know your results.  You will be notified by the hospital if any results are positive. Return to the ER if you develop fever, sudden pain in your side, testicular pain/swelling, or new or concerning symptoms.

## 2018-05-12 ENCOUNTER — Other Ambulatory Visit: Payer: Self-pay

## 2018-05-12 ENCOUNTER — Emergency Department (HOSPITAL_BASED_OUTPATIENT_CLINIC_OR_DEPARTMENT_OTHER): Payer: Self-pay

## 2018-05-12 ENCOUNTER — Emergency Department (HOSPITAL_BASED_OUTPATIENT_CLINIC_OR_DEPARTMENT_OTHER)
Admission: EM | Admit: 2018-05-12 | Discharge: 2018-05-12 | Disposition: A | Discharge status: Home / Self Care | Payer: Self-pay | Attending: General Surgery | Admitting: General Surgery

## 2018-05-12 ENCOUNTER — Encounter (HOSPITAL_COMMUNITY)
Admission: EM | Disposition: A | Discharge status: Home / Self Care | Payer: Self-pay | Source: Home / Self Care | Attending: Emergency Medicine

## 2018-05-12 ENCOUNTER — Emergency Department (HOSPITAL_COMMUNITY): Payer: Self-pay | Admitting: Certified Registered"

## 2018-05-12 ENCOUNTER — Encounter (HOSPITAL_BASED_OUTPATIENT_CLINIC_OR_DEPARTMENT_OTHER): Payer: Self-pay

## 2018-05-12 DIAGNOSIS — F17210 Nicotine dependence, cigarettes, uncomplicated: Secondary | ICD-10-CM | POA: Insufficient documentation

## 2018-05-12 DIAGNOSIS — K612 Anorectal abscess: Secondary | ICD-10-CM | POA: Insufficient documentation

## 2018-05-12 HISTORY — PX: INCISION AND DRAINAGE PERIRECTAL ABSCESS: SHX1804

## 2018-05-12 HISTORY — PX: ANOSCOPY: SHX5530

## 2018-05-12 LAB — BASIC METABOLIC PANEL
Anion gap: 11 (ref 5–15)
BUN: 9 mg/dL (ref 6–20)
CO2: 23 mmol/L (ref 22–32)
CREATININE: 0.85 mg/dL (ref 0.61–1.24)
Calcium: 8.8 mg/dL — ABNORMAL LOW (ref 8.9–10.3)
Chloride: 106 mmol/L (ref 98–111)
GFR calc Af Amer: >60 mL/min (ref >60)
GLUCOSE: 116 mg/dL — AB (ref 70–99)
POTASSIUM: 3.9 mmol/L (ref 3.5–5.1)
Sodium: 140 mmol/L (ref 135–145)

## 2018-05-12 LAB — CBC WITH DIFFERENTIAL/PLATELET
Basophils Absolute: 0.1 10*3/uL (ref 0.0–0.1)
Basophils Relative: 0 %
EOS ABS: 0 10*3/uL (ref 0.0–0.7)
Eosinophils Relative: 0 %
HCT: 39 % (ref 39.0–52.0)
Hemoglobin: 14.1 g/dL (ref 13.0–17.0)
Lymphocytes Relative: 10 %
Lymphs Abs: 1.5 10*3/uL (ref 0.7–4.0)
MCH: 28.7 pg (ref 26.0–34.0)
MCHC: 36.2 g/dL — AB (ref 30.0–36.0)
MCV: 79.3 fL (ref 78.0–100.0)
Monocytes Absolute: 1 10*3/uL (ref 0.1–1.0)
Monocytes Relative: 7 %
Neutro Abs: 12.1 10*3/uL — ABNORMAL HIGH (ref 1.7–7.7)
Neutrophils Relative %: 83 %
PLATELETS: 205 10*3/uL (ref 150–400)
RBC: 4.92 MIL/uL (ref 4.22–5.81)
RDW: 14 % (ref 11.5–15.5)
WBC: 14.7 10*3/uL — ABNORMAL HIGH (ref 4.0–10.5)

## 2018-05-12 SURGERY — INCISION AND DRAINAGE, ABSCESS, PERIRECTAL
Anesthesia: General | Site: Rectum

## 2018-05-12 MED ORDER — SUCCINYLCHOLINE CHLORIDE 200 MG/10ML IV SOSY
PREFILLED_SYRINGE | INTRAVENOUS | Status: DC | PRN
  Administered 2018-05-12: 120 mg via INTRAVENOUS

## 2018-05-12 MED ORDER — MORPHINE SULFATE (PF) 4 MG/ML IV SOLN
6.0000 mg | Freq: Once | INTRAVENOUS | Status: AC
  Administered 2018-05-12: 6 mg via INTRAVENOUS
  Filled 2018-05-12: qty 2

## 2018-05-12 MED ORDER — SODIUM CHLORIDE 0.9 % IV SOLN
INTRAVENOUS | Status: DC | PRN
  Administered 2018-05-12: 1000 mL via INTRAVENOUS

## 2018-05-12 MED ORDER — OXYCODONE HCL 5 MG PO TABS: 5.0000 mg | ORAL_TABLET | ORAL | 0 refills | Status: AC | PRN

## 2018-05-12 MED ORDER — PIPERACILLIN-TAZOBACTAM 3.375 G IVPB 30 MIN
3.3750 g | Freq: Once | INTRAVENOUS | Status: AC
  Administered 2018-05-12: 3.375 g via INTRAVENOUS
  Filled 2018-05-12 (×2): qty 50

## 2018-05-12 MED ORDER — IOPAMIDOL (ISOVUE-300) INJECTION 61%
100.0000 mL | Freq: Once | INTRAVENOUS | Status: AC | PRN
  Administered 2018-05-12: 100 mL via INTRAVENOUS

## 2018-05-12 MED ORDER — 0.9 % SODIUM CHLORIDE (POUR BTL) OPTIME
TOPICAL | Status: DC | PRN
  Administered 2018-05-12: 1000 mL

## 2018-05-12 MED ORDER — LACTATED RINGERS IV SOLN
INTRAVENOUS | Status: DC
  Administered 2018-05-12 (×2): via INTRAVENOUS

## 2018-05-12 MED ORDER — FENTANYL CITRATE (PF) 250 MCG/5ML IJ SOLN
INTRAMUSCULAR | Status: DC | PRN
  Administered 2018-05-12: 100 ug via INTRAVENOUS

## 2018-05-12 MED ORDER — PROPOFOL 10 MG/ML IV BOLUS
INTRAVENOUS | Status: DC | PRN
  Administered 2018-05-12: 150 mg via INTRAVENOUS

## 2018-05-12 MED ORDER — OXYCODONE HCL 5 MG PO TABS
ORAL_TABLET | ORAL | Status: AC
  Administered 2018-05-12: 5 mg via ORAL
  Filled 2018-05-12: qty 1

## 2018-05-12 MED ORDER — PROPOFOL 10 MG/ML IV BOLUS
INTRAVENOUS | Status: AC
  Filled 2018-05-12: qty 20

## 2018-05-12 MED ORDER — MIDAZOLAM HCL 2 MG/2ML IJ SOLN
1.0000 mg | Freq: Once | INTRAMUSCULAR | Status: AC
Start: 2018-05-12 — End: 2018-05-12
  Administered 2018-05-12: 1 mg via INTRAVENOUS
  Filled 2018-05-12: qty 2

## 2018-05-12 MED ORDER — BUPIVACAINE LIPOSOME 1.3 % IJ SUSP
20.0000 mL | Freq: Once | INTRAMUSCULAR | Status: DC
  Filled 2018-05-12: qty 20

## 2018-05-12 MED ORDER — LIDOCAINE 2% (20 MG/ML) 5 ML SYRINGE
INTRAMUSCULAR | Status: DC | PRN
  Administered 2018-05-12: 100 mg via INTRAVENOUS

## 2018-05-12 MED ORDER — BUPIVACAINE-EPINEPHRINE (PF) 0.5% -1:200000 IJ SOLN
INTRAMUSCULAR | Status: DC | PRN
  Administered 2018-05-12: 10 mL via PERINEURAL

## 2018-05-12 MED ORDER — AMOXICILLIN-POT CLAVULANATE 875-125 MG PO TABS: 1.0000 | ORAL_TABLET | Freq: Two times a day (BID) | ORAL | 0 refills | Status: AC

## 2018-05-12 MED ORDER — BUPIVACAINE-EPINEPHRINE (PF) 0.5% -1:200000 IJ SOLN
INTRAMUSCULAR | Status: AC
  Filled 2018-05-12: qty 30

## 2018-05-12 MED ORDER — ONDANSETRON HCL 4 MG/2ML IJ SOLN
INTRAMUSCULAR | Status: DC | PRN
  Administered 2018-05-12: 4 mg via INTRAVENOUS

## 2018-05-12 MED ORDER — FENTANYL CITRATE (PF) 250 MCG/5ML IJ SOLN
INTRAMUSCULAR | Status: AC
  Filled 2018-05-12: qty 5

## 2018-05-12 MED ORDER — FENTANYL CITRATE (PF) 100 MCG/2ML IJ SOLN
50.0000 ug | Freq: Once | INTRAMUSCULAR | Status: AC
  Administered 2018-05-12: 50 ug via INTRAVENOUS
  Filled 2018-05-12: qty 2

## 2018-05-12 MED ORDER — HYDROMORPHONE HCL 1 MG/ML IJ SOLN: 0.2500 mg | INTRAMUSCULAR | Status: DC | PRN

## 2018-05-12 MED ORDER — OXYCODONE HCL 5 MG PO TABS
5.0000 mg | ORAL_TABLET | Freq: Once | ORAL | Status: AC
  Administered 2018-05-12: 5 mg via ORAL

## 2018-05-12 MED ORDER — ONDANSETRON HCL 4 MG/2ML IJ SOLN
4.0000 mg | Freq: Once | INTRAMUSCULAR | Status: AC
  Administered 2018-05-12: 4 mg via INTRAVENOUS

## 2018-05-12 MED ORDER — ONDANSETRON HCL 4 MG/2ML IJ SOLN
INTRAMUSCULAR | Status: AC
  Filled 2018-05-12: qty 2

## 2018-05-12 MED ORDER — MIDAZOLAM HCL 2 MG/2ML IJ SOLN
INTRAMUSCULAR | Status: AC
  Filled 2018-05-12: qty 2

## 2018-05-12 MED ORDER — SODIUM CHLORIDE 0.9 % IV BOLUS
1000.0000 mL | Freq: Once | INTRAVENOUS | Status: AC
  Administered 2018-05-12: 1000 mL via INTRAVENOUS

## 2018-05-12 MED ORDER — MIDAZOLAM HCL 2 MG/2ML IJ SOLN: INTRAMUSCULAR | Status: DC | PRN

## 2018-05-12 SURGICAL SUPPLY — 27 items
BLADE HEX COATED 2.75 (ELECTRODE) IMPLANT
BLADE SURG 15 STRL LF DISP TIS (BLADE) IMPLANT
BLADE SURG 15 STRL SS (BLADE)
BRIEF STRETCH FOR OB PAD LRG (UNDERPADS AND DIAPERS) ×4 IMPLANT
COVER SURGICAL LIGHT HANDLE (MISCELLANEOUS) ×4 IMPLANT
ELECT PENCIL ROCKER SW 15FT (MISCELLANEOUS) IMPLANT
ELECT REM PT RETURN 15FT ADLT (MISCELLANEOUS) ×4 IMPLANT
GAUZE 4X4 16PLY RFD (DISPOSABLE) IMPLANT
GAUZE PACKING IODOFORM 1/4X15 (GAUZE/BANDAGES/DRESSINGS) ×4 IMPLANT
GAUZE SPONGE 4X4 12PLY STRL (GAUZE/BANDAGES/DRESSINGS) ×4 IMPLANT
GLOVE BIOGEL PI IND STRL 7.0 (GLOVE) ×2 IMPLANT
GLOVE BIOGEL PI INDICATOR 7.0 (GLOVE) ×2
GLOVE EUDERMIC 7 POWDERFREE (GLOVE) ×4 IMPLANT
GOWN STRL REUS W/TWL LRG LVL3 (GOWN DISPOSABLE) ×4 IMPLANT
GOWN STRL REUS W/TWL XL LVL3 (GOWN DISPOSABLE) ×8 IMPLANT
LUBRICANT JELLY K Y 4OZ (MISCELLANEOUS) ×4 IMPLANT
NEEDLE HYPO 22GX1.5 SAFETY (NEEDLE) IMPLANT
PACK GENERAL/GYN (CUSTOM PROCEDURE TRAY) ×4 IMPLANT
PACK LITHOTOMY IV (CUSTOM PROCEDURE TRAY) IMPLANT
SOL PREP PROV IODINE SCRUB 4OZ (MISCELLANEOUS) ×4 IMPLANT
SWAB COLLECTION DEVICE MRSA (MISCELLANEOUS) ×4 IMPLANT
SYR CONTROL 10ML LL (SYRINGE) ×4 IMPLANT
TAPE CLOTH SURG 4X10 WHT LF (GAUZE/BANDAGES/DRESSINGS) IMPLANT
TOWEL OR 17X26 10 PK STRL BLUE (TOWEL DISPOSABLE) ×4 IMPLANT
TUBE ANAEROBIC SPECIMEN COL (MISCELLANEOUS) ×4 IMPLANT
UNDERPAD 30X30 (UNDERPADS AND DIAPERS) IMPLANT
YANKAUER SUCT BULB TIP 10FT TU (MISCELLANEOUS) IMPLANT

## 2018-05-12 NOTE — ED Notes (Signed)
ED Provider at bedside discussing test results and plan of care. 

## 2018-05-12 NOTE — Progress Notes (Signed)
Dr Chilton SiGreen in to see patient. Order received for pain medication.

## 2018-05-12 NOTE — ED Triage Notes (Signed)
Pt c/o headache onset last night, hot and cold sweats. Pt also reports pain in rectum onset x 1 day ago.

## 2018-05-12 NOTE — Anesthesia Preprocedure Evaluation (Addendum)
Anesthesia Evaluation  Patient identified by MRN, date of birth, ID band Patient awake    Reviewed: Allergy & Precautions, NPO status , Patient's Chart, lab work & pertinent test results  Airway Mallampati: II  TM Distance: >3 FB     Dental   Pulmonary Current Smoker,    breath sounds clear to auscultation       Cardiovascular negative cardio ROS   Rhythm:Regular Rate:Normal     Neuro/Psych    GI/Hepatic negative GI ROS, Neg liver ROS,   Endo/Other  negative endocrine ROS  Renal/GU negative Renal ROS     Musculoskeletal   Abdominal   Peds  Hematology   Anesthesia Other Findings   Reproductive/Obstetrics                             Anesthesia Physical Anesthesia Plan  ASA: II  Anesthesia Plan: General   Post-op Pain Management:    Induction: Intravenous  PONV Risk Score and Plan: Ondansetron, Dexamethasone and Midazolam  Airway Management Planned: LMA and Oral ETT  Additional Equipment:   Intra-op Plan:   Post-operative Plan: Extubation in OR  Informed Consent: I have reviewed the patients History and Physical, chart, labs and discussed the procedure including the risks, benefits and alternatives for the proposed anesthesia with the patient or authorized representative who has indicated his/her understanding and acceptance.   Dental advisory given  Plan Discussed with: CRNA and Anesthesiologist  Anesthesia Plan Comments:        Anesthesia Quick Evaluation

## 2018-05-12 NOTE — Transfer of Care (Signed)
Immediate Anesthesia Transfer of Care Note  Patient: Oscar ArenaKenneth Acosta  Procedure(s) Performed: IRRIGATION AND DEBRIDEMENT PERIRECTAL ABSCESS (N/A ) EXAM UNDER ANESTHESIA (Rectum) ANOSCOPY (Rectum)  Patient Location: PACU  Anesthesia Type:General  Level of Consciousness: sedated  Airway & Oxygen Therapy: Patient Spontanous Breathing and Patient connected to face mask oxygen  Post-op Assessment: Report given to RN and Post -op Vital signs reviewed and stable  Post vital signs: Reviewed and stable  Last Vitals:  Vitals Value Taken Time  BP    Temp    Pulse    Resp 22 05/12/2018  1:00 PM  SpO2    Vitals shown include unvalidated device data.  Last Pain:  Vitals:   05/12/18 1156  TempSrc:   PainSc: Asleep      Patients Stated Pain Goal: 3 (05/12/18 1037)  Complications: No apparent anesthesia complications

## 2018-05-12 NOTE — Op Note (Signed)
Patient Name:           Oscar Acosta   Date of Surgery:        05/12/2018  Pre op Diagnosis:      Perirectal abscess  Post op Diagnosis:    Same  Procedure:                 Exam under anesthesia, anoscopy, incision and drainage perirectal abscess  Surgeon:                     Angelia MouldHaywood M. Derrell LollingIngram, M.D., FACS  Assistant:                      OR staff  Operative Indications:   This is a healthy 37 year old man who presents with a 2-day history of rectal pain fever and chills.  External anal exam reveals no obvious abscess but he is tender somewhat on the right side.  CT scan showed a 3 cm abscess on the right side.  He is brought to the operating room for exam under anesthesia and drainage of his abscess  Operative Findings:       Anoscopy to about 8 or 9 cm was normal.  I did not see any abnormal mucosa.  There was no drainage.  I could not palpate anything within the wall of the rectum anywhere.  I was then able to palpate the abscess in the posterior midline, just to the right of the midline.  I aspirated purulent material for culture and then drained the abscess.  Procedure in Detail:          Following the induction of general endotracheal anesthesia the patient was positioned in the prone jackknife position.  The gluteal areas were taped to the side.  Perianal area was prepped and draped in a sterile fashion.  Intravenous antibiotics have been given a couple of hours ago.  Surgical timeout was performed.  0.5% Marcaine with epinephrine was used as a local infiltration anesthetic.     Digital rectal exam initially did not reveal any mass.  Anoscopy to 8 or 9 cm with good light revealed normal healthy mucosa.  No inflammation.  No mass or bulge and no drainage.  I then further palpated the sphincter areas and felt a mass in the posterior midline just slightly to the right of the midline.  I then took a large syringe and needle and aspirated purulent fluid and sent that for culture.  I made a  radially oriented incision in the posterior midline just to the right and entered the abscess cavity.  I spread the tissues with a blunt instrument and then digitally explored the abscess cavity to make sure all the loculations were broken up.  I was clearly external to the sphincters.  The abscess cavity was irrigated.  It appeared to be a unilocular abscess.  Hemostasis was excellent.  I placed a 1/4 inch iodoform gauze wick up into the abscess cavity and cut it extra long.  The wound was then covered with 4 x 4's ABDs and fishnet panties.  The patient tolerated the procedure well was taken to PACU in stable condition.  EBL 15 cc.  Counts correct.  Complications none.     Angelia MouldHaywood M. Derrell LollingIngram, M.D., FACS General and Minimally Invasive Surgery Breast and Colorectal Surgery  05/12/2018 12:52 PM

## 2018-05-12 NOTE — Discharge Instructions (Signed)
Sit in a warm bath 3x/day.  May remove packing from wound after first bath tomorrow, 8/9.  May place dry gauze or pads in underwear to help with drainage.  Take stool softeners to assist with bowel movements while this is healing.     How to Take a Sitz Bath A sitz bath is a warm water bath that is taken while you are sitting down. The water should only come up to your hips and should cover your buttocks. Your health care provider may recommend a sitz bath to help you:  Clean the lower part of your body, including your genital area.  With itching.  With pain.  With sore muscles or muscles that tighten or spasm.  How to take a sitz bath Take 3-4 sitz baths per day or as told by your health care provider. 1. Partially fill a bathtub with warm water. You will only need the water to be deep enough to cover your hips and buttocks when you are sitting in it. 2. If your health care provider told you to put medicine in the water, follow the directions exactly. 3. Sit in the water and open the tub drain a little. 4. Turn on the warm water again to keep the tub at the correct level. Keep the water running constantly. 5. Soak in the water for 15-20 minutes or as told by your health care provider. 6. After the sitz bath, pat the affected area dry first. Do not rub it. 7. Be careful when you stand up after the sitz bath because you may feel dizzy.  Contact a health care provider if:  Your symptoms get worse. Do not continue with sitz baths if your symptoms get worse.  You have new symptoms. Do not continue with sitz baths until you talk with your health care provider. This information is not intended to replace advice given to you by your health care provider. Make sure you discuss any questions you have with your health care provider. Document Released: 06/13/2004 Document Revised: 02/19/2016 Document Reviewed: 09/19/2014 Elsevier Interactive Patient Education  2018 [employer]  General  Anesthesia, Adult, Care After These instructions provide you with information about caring for yourself after your procedure. Your health care provider may also give you more specific instructions. Your treatment has been planned according to current medical practices, but problems sometimes occur. Call your health care provider if you have any problems or questions after your procedure. What can I expect after the procedure? After the procedure, it is common to have:  Vomiting.  A sore throat.  Mental slowness.  It is common to feel:  Nauseous.  Cold or shivery.  Sleepy.  Tired.  Sore or achy, even in parts of your body where you did not have surgery.  Follow these instructions at home: For at least 24 hours after the procedure:  Do not: ? Participate in activities where you could fall or become injured. ? Drive. ? Use heavy machinery. ? Drink alcohol. ? Take sleeping pills or medicines that cause drowsiness. ? Make important decisions or sign legal documents. ? Take care of children on your own.  Rest. Eating and drinking  If you vomit, drink water, juice, or soup when you can drink without vomiting.  Drink enough fluid to keep your urine clear or pale yellow.  Make sure you have little or no nausea before eating solid foods.  Follow the diet recommended by your health care provider. General instructions  Have a responsible adult stay  with you until you are awake and alert.  Return to your normal activities as told by your health care provider. Ask your health care provider what activities are safe for you.  Take over-the-counter and prescription medicines only as told by your health care provider.  If you smoke, do not smoke without supervision.  Keep all follow-up visits as told by your health care provider. This is important. Contact a health care provider if:  You continue to have nausea or vomiting at home, and medicines are not helpful.  You cannot  drink fluids or start eating again.  You cannot urinate after 8-12 hours.  You develop a skin rash.  You have fever.  You have increasing redness at the site of your procedure. Get help right away if:  You have difficulty breathing.  You have chest pain.  You have unexpected bleeding.  You feel that you are having a life-threatening or urgent problem. This information is not intended to replace advice given to you by your health care provider. Make sure you discuss any questions you have with your health care provider. Document Released: 12/28/2000 Document Revised: 02/24/2016 Document Reviewed: 09/05/2015 Elsevier Interactive Patient Education  [employer].

## 2018-05-12 NOTE — ED Provider Notes (Signed)
[facility] EMERGENCY DEPARTMENT Provider Note   CSN: [id number] Arrival date & time: 05/12/18  [id number]     History   Chief Complaint Chief Complaint  Patient presents with  . Rectal Pain  . Headache    HPI Oscar Acosta is a 37 y.o. male.  HPI Patient is a healthy 37 year old male who presents to the emergency department with complaints of fevers chills and rectal pain over the past 24 hours.  Reports it is too painful to sit on.  No history of hemorrhoids.  Reports painful bowel movements.  No diarrhea or blood in his stool.  He does have a history of cutaneous abscess but no history of perirectal abscess.  Presents to the ER with an oral temp of 100.2.  He also reports moderate headache.  No chest pain or cough.  No abdominal pain.  Symptoms are moderate to severe in severity.   History reviewed. No pertinent past medical history.  There are no active problems to display for this patient.   Past Surgical History:  Procedure Laterality Date  . APPENDECTOMY          Home Medications    Prior to Admission medications   Medication Sig Start Date End Date Taking? Authorizing Provider  cyclobenzaprine (FLEXERIL) 10 MG tablet Take 1 tablet (10 mg total) by mouth 2 (two) times daily as needed for muscle spasms. 07/30/17   Vanetta MuldersZackowski, Scott, MD  naphazoline (NAPHCON) 0.1 % ophthalmic solution Place 1 drop into the right eye 4 (four) times daily as needed for eye irritation. 12/13/17   Robinson, [place]az[place]Jordan N, PA-C  naproxen (NAPROSYN) 500 MG tablet Take 1 tablet (500 mg total) by mouth 2 (two) times daily. 07/30/17   Vanetta MuldersZackowski, Scott, MD  phenazopyridine (PYRIDIUM) 200 MG tablet Take 1 tablet (200 mg total) by mouth 3 (three) times daily. 12/13/17   Robinson, [place] N, PA-C    Family History History reviewed. No pertinent family history.  Social History Social History   Tobacco Use  . Smoking status: Current Every Day Smoker    Packs/day: 0.50    Types: Cigarettes  .  Smokeless tobacco: Never Used  Substance Use Topics  . Alcohol use: No  . Drug use: No     Allergies   Patient has no known allergies.   Review of Systems Review of Systems  All other systems reviewed and are negative.    Physical Exam Updated Vital Signs BP (!) 146/80 (BP Location: Right Arm)   Pulse 76   Temp 100.2 F (37.9 C) (Oral)   Resp 18   Ht 6\' 1"  (1.854 m)   Wt 83.7 kg   SpO2 100%   BMI 24.36 kg/m   Physical Exam  Constitutional: He is oriented to person, place, and time. He appears well-developed and well-nourished.  HENT:  Head: Normocephalic and atraumatic.  Eyes: EOM are normal.  Neck: Normal range of motion.  Cardiovascular: Normal rate, regular rhythm and normal heart sounds.  Pulmonary/Chest: Effort normal and breath sounds normal. No respiratory distress.  Abdominal: Soft. He exhibits no distension. There is no tenderness.  Genitourinary:  Genitourinary Comments: Normal perirectal examination.  Soft perineum.  Mild tenderness on the right side of his rectal area without obvious external signs of infection or abscess.  No hemorrhoids present.  No fissures present.  Musculoskeletal: Normal range of motion.  Neurological: He is alert and oriented to person, place, and time.  Skin: Skin is warm and dry.  Psychiatric: He has a  normal mood and affect. Judgment normal.  Nursing note and vitals reviewed.    ED Treatments / Results  Labs (all labs ordered are listed, but only abnormal results are displayed) Labs Reviewed  CBC WITH DIFFERENTIAL/PLATELET  BASIC METABOLIC PANEL    EKG None  Radiology No results found.  Procedures Procedures (including critical care time)  Medications Ordered in ED Medications  sodium chloride 0.9 % bolus 1,000 mL (has no administration in time range)  morphine 4 MG/ML injection 6 mg (has no administration in time range)     Initial Impression / Assessment and Plan / ED Course  I have reviewed the triage  vital signs and the nursing notes.  Pertinent labs & imaging results that were available during my care of the patient were reviewed by me and considered in my medical decision making (see chart for details).     Hesitation concerning for perirectal abscess.  Patient undergo CT imaging of his pelvis at this time.  Labs pending.  Pain will be treated.  Fluids now.  Abdominal exam is benign.  Care to Dr Rush Landmark  Final Clinical Impressions(s) / ED Diagnoses   Final diagnoses:  None    ED Discharge Orders    None       Azalia Bilis, MD 05/12/18 [phone]

## 2018-05-12 NOTE — ED Notes (Signed)
Patient transported to CT 

## 2018-05-12 NOTE — ED Notes (Signed)
ED Provider at bedside discussing plan of care for [facility] for consult with surgeon.

## 2018-05-12 NOTE — ED Provider Notes (Signed)
7:58 AM Care assumed from Dr. Patria Maneampos.   At time of transfer care, patient is awaiting CT scan and lab testing to further evaluate possible perianal/perirectal abscess.  Patient reported onset of pain in his rectum since yesterday.  He has not had a bowel movement since yesterday when he had an extremity painful BM.  He reports fevers and chills and his temperature was over 100 here.  Patient reports his pain was 10 out of 10.  He has a history of superficial abscesses in the past but no history of anal abscess.  Dr. Lyndal Rainbowapps reports his exam did not show any external changes amenable to an external drainage.  CT scan was was performed.    CT scan shows a 3 cm abscess near the rectum/anus extending rightward.  Laboratory testing shows a white count of 14.7.  Due to the concern for abscess that does not appear to be drainable from the outside, general surgery will be called.  Patient reported his pain had somewhat improved with pain medication and nausea medicine.    Anticipate following up on surgery recommendation.  8:11 AM Spoke with Dr. Derrell LollingIngram [facility], he request patient be transferred ED to [facility] to [facility] and for him to be called for assessment.  General surgery requested Zosyn administration which was ordered.  Patient's vital signs reveal no tachycardia or hypotension.  8:14 AM Spoke with Dr[facility] Rubin PayorPickering in [facility] who accepts patient transfer.  Patient will be transferred for evaluation.  Surgery call back to request him be taken directly to the operating room.  Patient was transferred without difficulty.   Clinical Impression: 1. Anorectal abscess     Disposition: Admit  This note was prepared with assistance of Dragon voice recognition software. Occasional wrong-word or sound-a-like substitutions may have occurred due to the inherent limitations of voice recognition software.      Tegeler,  Canary Brimhristopher J, MD 05/12/18 1350

## 2018-05-12 NOTE — H&P (Signed)
Oscar Acosta [birth year]  [id number].    Chief Complaint/Reason for Consult: perirectal abscess  HPI:  This is a 37 yo black male with no previous PMH who began having perirectal pain yesterday around midday.  He did not have any symptoms or pain before this time.  He denies any N/V/D.  He did have a normal BM yesterday, but states this was very painful.  No discharge noted.  He had a fever of 99 last night and states he had one of 102 upon arrival to the ED early this morning.  Upon arrival to the ED, he had labs which revealed a WBC of 14K with a CT showing a 3cm abscess posteriorly and rightward at 6 o'clock to the anus.  No evidence of this abscess on external exam and therefore, we were consulted for surgical intervention.  ROS: ROS : Please see HPI, otherwise all other systems are negative except some shortness of breath since this started.  GF tells him that he routinely stops breathing at night and then starts all of the sudden and he snores.  History reviewed. No pertinent family history.  History reviewed. No pertinent past medical history.  Past Surgical History:  Procedure Laterality Date  . APPENDECTOMY      Social History:  reports that he has been smoking cigarettes. He has been smoking about 0.50 packs per day. He has never used smokeless tobacco. He reports that he does not drink alcohol or use drugs.  Allergies: No Known Allergies  Medications Prior to Admission  Medication Sig Dispense Refill  . cyclobenzaprine (FLEXERIL) 10 MG tablet Take 1 tablet (10 mg total) by mouth 2 (two) times daily as needed for muscle spasms. 20 tablet 0  . naphazoline (NAPHCON) 0.1 % ophthalmic solution Place 1 drop into the right eye 4 (four) times daily as needed for eye irritation. 15 mL 0  . naproxen (NAPROSYN) 500 MG tablet Take 1 tablet (500 mg total) by mouth 2 (two) times daily. 14 tablet 0  . phenazopyridine (PYRIDIUM) 200 MG tablet Take 1 tablet (200 mg total) by mouth 3  (three) times daily. 6 tablet 0     Physical Exam: Blood pressure 140/79, pulse (!) 52, temperature 99.1 F (37.3 C), temperature source Oral, resp. rate 18, height '6\' 1"'$  (1.854 m), weight 83.5 kg, SpO2 (!) 9 %. General: pleasant, WD, WN black male who is laying in bed in NAD HEENT: head is normocephalic, atraumatic.  Sclera are noninjected.  PERRL.  Ears and nose without any masses or lesions.  Mouth is pink and moist Heart: regular, rate, and rhythm.  Normal s1,s2. No obvious murmurs, gallops, or rubs noted.  Palpable radial and pedal pulses bilaterally Lungs: CTAB, no wheezes, rhonchi, or rales noted.  Respiratory effort nonlabored Abd: soft, NT, ND, +BS, no masses, hernias, or organomegaly Rectum: normal external exam with tenderness on right side of the anus.  DRE was deferred as he is getting ready to go to OR for this evaluation. MS: all 4 extremities are symmetrical with no cyanosis, clubbing, or edema. Skin: warm and dry with no masses, lesions, or rashes Psych: A&Ox3 with an appropriate affect.   Results for orders placed or performed during the hospital encounter of 05/12/18 (from the past 48 hour(s))  CBC with Differential/Platelet     Status: Abnormal   Collection Time: 05/12/18  7:06 AM  Result Value Ref Range   WBC 14.7 (H) 4.0 - 10.5 K/uL   RBC 4.92 4.22 -  5.81 MIL/uL   Hemoglobin 14.1 13.0 - 17.0 g/dL   HCT 39.0 39.0 - 52.0 %   MCV 79.3 78.0 - 100.0 fL   MCH 28.7 26.0 - 34.0 pg   MCHC 36.2 (H) 30.0 - 36.0 g/dL   RDW 14.0 11.5 - 15.5 %   Platelets 205 150 - 400 K/uL   Neutrophils Relative % 83 %   Neutro Abs 12.1 (H) 1.7 - 7.7 K/uL   Lymphocytes Relative 10 %   Lymphs Abs 1.5 0.7 - 4.0 K/uL   Monocytes Relative 7 %   Monocytes Absolute 1.0 0.1 - 1.0 K/uL   Eosinophils Relative 0 %   Eosinophils Absolute 0.0 0.0 - 0.7 K/uL   Basophils Relative 0 %   Basophils Absolute 0.1 0.0 - 0.1 K/uL    Comment: Performed at [facility], [address]  [place], [place] [address]  Basic metabolic panel     Status: Abnormal   Collection Time: 05/12/18  7:06 AM  Result Value Ref Range   Sodium 140 135 - 145 mmol/L   Potassium 3.9 3.5 - 5.1 mmol/L   Chloride 106 98 - 111 mmol/L   CO2 23 22 - 32 mmol/L   Glucose, Bld 116 (H) 70 - 99 mg/dL   BUN 9 6 - 20 mg/dL   Creatinine, Ser 0.85 0.61 - 1.24 mg/dL   Calcium 8.8 (L) 8.9 - 10.3 mg/dL   GFR calc non Af Amer >60 >60 mL/min   GFR calc Af Amer >60 >60 mL/min    Comment: (NOTE) The eGFR has been calculated using the CKD EPI equation. This calculation has not been validated in all clinical situations. eGFR's persistently <60 mL/min signify possible Chronic Kidney Disease.    Anion gap 11 5 - 15    Comment: Performed at [facility], [address] [place], [place] [address]   Ct Pelvis W Contrast  Result Date: 05/12/2018 CLINICAL DATA:  Vicente Males rectal abscess. Perianal pain with fever and chills EXAM: CT PELVIS WITH CONTRAST TECHNIQUE: Multidetector CT imaging of the pelvis was performed using the standard protocol following the bolus administration of intravenous contrast. CONTRAST:  183m ISOVUE-300 IOPAMIDOL (ISOVUE-300) INJECTION 61% COMPARISON:  None. FINDINGS: Urinary Tract:  Negative Bowel: Rim enhancing low-density collection posterior to the anus and extending rightward, 26 x 29 x 19 mm. No retroperitoneal/supralevator extension. No soft tissue emphysema in the perineum. Vascular/Lymphatic: Negative Reproductive:  Negative Other:  Negative Musculoskeletal: Bilateral hip joint narrowing with marginal spurring and articular bodies on the left at least. Small focus of subcutaneous inflammation in the upper right thigh. IMPRESSION: 1. Up to 3 cm abscess extending posteriorly and rightward from the 6 o'clock position of the anus. 2. 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 Electronically Signed   By: JMonte FantasiaM.D.   On: 05/12/2018 07:41       Assessment/Plan Perirectal abscess Patient is going to the OR for surgical intervention and drainage of this abscess.  If all goes well, he may be discharged home from PACU on oral abx therapy.  He has received a dose of zosyn already at 0900.  The procedure has been discussed with him along with risks and complications.  The patient understands and is agreeable to proceed.  KHenreitta Cea [facility] 05/12/2018, 10:51 AM Pager: [phone]

## 2018-05-12 NOTE — Anesthesia Procedure Notes (Signed)
Procedure Name: Intubation Date/Time: 05/12/2018 12:11 PM Performed by: Lind Covert, CRNA Pre-anesthesia Checklist: Patient identified, Emergency Drugs available, Suction available and Patient being monitored Patient Re-evaluated:Patient Re-evaluated prior to induction Oxygen Delivery Method: Circle system utilized Preoxygenation: Pre-oxygenation with 100% oxygen Induction Type: IV induction Ventilation: Mask ventilation without difficulty Laryngoscope Size: Mac and 3 Grade View: Grade I Tube type: Oral Number of attempts: 1 Airway Equipment and Method: Stylet and Oral airway Placement Confirmation: ETT inserted through vocal cords under direct vision,  positive ETCO2 and breath sounds checked- equal and bilateral Secured at: 22 cm Tube secured with: Tape Dental Injury: Teeth and Oropharynx as per pre-operative assessment

## 2018-05-12 NOTE — Anesthesia Procedure Notes (Signed)
Date/Time: 05/12/2018 12:54 PM Performed by: Doran ClayAlday, Florrie Ramires R, CRNA Oxygen Delivery Method: Simple face mask Placement Confirmation: positive ETCO2 and breath sounds checked- equal and bilateral Dental Injury: Teeth and Oropharynx as per pre-operative assessment

## 2018-05-12 NOTE — Anesthesia Postprocedure Evaluation (Signed)
Anesthesia Post Note  Patient: Oscar ArenaKenneth Acosta  Procedure(s) Performed: IRRIGATION AND DEBRIDEMENT PERIRECTAL ABSCESS (N/A ) EXAM UNDER ANESTHESIA (Rectum) ANOSCOPY (Rectum)     Patient location during evaluation: PACU Anesthesia Type: General Level of consciousness: awake Pain management: pain level controlled Vital Signs Assessment: post-procedure vital signs reviewed and stable Respiratory status: spontaneous breathing Cardiovascular status: stable Postop Assessment: no headache Anesthetic complications: no    Last Vitals:  Vitals:   05/12/18 1330 05/12/18 1345  BP: 139/85 131/82  Pulse: (!) 59 62  Resp: 13 13  Temp:    SpO2: 100% 100%    Last Pain:  Vitals:   05/12/18 1330  TempSrc:   PainSc: Asleep                 Remy Dia

## 2018-05-13 ENCOUNTER — Encounter (HOSPITAL_COMMUNITY): Payer: Self-pay | Admitting: General Surgery

## 2018-05-17 LAB — CULTURE, BLOOD (ROUTINE X 2)
CULTURE: NO GROWTH
Culture: NO GROWTH
SPECIAL REQUESTS: ADEQUATE
Special Requests: ADEQUATE

## 2018-05-17 LAB — AEROBIC/ANAEROBIC CULTURE W GRAM STAIN (SURGICAL/DEEP WOUND)

## 2018-05-17 LAB — AEROBIC/ANAEROBIC CULTURE (SURGICAL/DEEP WOUND)
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
## 2019-06-19 ENCOUNTER — Other Ambulatory Visit: Payer: Self-pay

## 2019-06-19 ENCOUNTER — Encounter (HOSPITAL_COMMUNITY): Payer: Self-pay | Admitting: Emergency Medicine

## 2019-06-19 ENCOUNTER — Emergency Department (HOSPITAL_COMMUNITY): Payer: Self-pay

## 2019-06-19 ENCOUNTER — Emergency Department (HOSPITAL_COMMUNITY)
Admission: EM | Admit: 2019-06-19 | Discharge: 2019-06-20 | Disposition: A | Discharge status: Home / Self Care | Payer: Self-pay | Attending: Emergency Medicine | Admitting: Emergency Medicine

## 2019-06-19 DIAGNOSIS — E876 Hypokalemia: Secondary | ICD-10-CM | POA: Insufficient documentation

## 2019-06-19 DIAGNOSIS — F17210 Nicotine dependence, cigarettes, uncomplicated: Secondary | ICD-10-CM | POA: Insufficient documentation

## 2019-06-19 DIAGNOSIS — R0789 Other chest pain: Secondary | ICD-10-CM | POA: Insufficient documentation

## 2019-06-19 LAB — BASIC METABOLIC PANEL
Anion gap: 10 (ref 5–15)
BUN: 10 mg/dL (ref 6–20)
CO2: 21 mmol/L — ABNORMAL LOW (ref 22–32)
Calcium: 8.6 mg/dL — ABNORMAL LOW (ref 8.9–10.3)
Chloride: 110 mmol/L (ref 98–111)
Creatinine, Ser: 1.12 mg/dL (ref 0.61–1.24)
GFR calc Af Amer: >60 mL/min (ref >60)
GFR calc non Af Amer: >60 mL/min (ref >60)
Glucose, Bld: 102 mg/dL — ABNORMAL HIGH (ref 70–99)
Potassium: 3.3 mmol/L — ABNORMAL LOW (ref 3.5–5.1)
Sodium: 141 mmol/L (ref 135–145)

## 2019-06-19 LAB — CBC
HCT: 38.6 % — ABNORMAL LOW (ref 39.0–52.0)
Hemoglobin: 13.8 g/dL (ref 13.0–17.0)
MCH: 29.4 pg (ref 26.0–34.0)
MCHC: 35.8 g/dL (ref 30.0–36.0)
MCV: 82.3 fL (ref 80.0–100.0)
Platelets: 223 10*3/uL (ref 150–400)
RBC: 4.69 MIL/uL (ref 4.22–5.81)
RDW: 13.2 % (ref 11.5–15.5)
WBC: 7 10*3/uL (ref 4.0–10.5)
nRBC: 0 % (ref 0.0–0.2)

## 2019-06-19 LAB — TROPONIN I (HIGH SENSITIVITY): Troponin I (High Sensitivity): 3 ng/L (ref <18)

## 2019-06-19 MED ORDER — SODIUM CHLORIDE 0.9% FLUSH: 3.0000 mL | Freq: Once | INTRAVENOUS | Status: DC

## 2019-06-19 NOTE — ED Triage Notes (Signed)
Pt reports onset of right sided chest pain, "feels like my chest is thumping" for the past three days.  Pt also reports SOB.  Denies N/V/fevers.  Pt states he has had "my chest collapse in the past."

## 2019-06-19 NOTE — ED Notes (Signed)
Oscar Acosta [phone] wants phone call when able to go back with patient

## 2019-06-20 LAB — TROPONIN I (HIGH SENSITIVITY): Troponin I (High Sensitivity): 4 ng/L (ref <18)

## 2019-06-20 MED ORDER — POTASSIUM CHLORIDE CRYS ER 20 MEQ PO TBCR
40.0000 meq | EXTENDED_RELEASE_TABLET | Freq: Once | ORAL | Status: AC
  Administered 2019-06-20: 40 meq via ORAL
  Filled 2019-06-20: qty 2

## 2019-06-20 MED ORDER — KETOROLAC TROMETHAMINE 30 MG/ML IJ SOLN
30.0000 mg | Freq: Once | INTRAMUSCULAR | Status: AC
  Administered 2019-06-20: 30 mg via INTRAMUSCULAR
  Filled 2019-06-20: qty 1

## 2019-06-20 MED ORDER — NAPROXEN 500 MG PO TABS: 500.0000 mg | ORAL_TABLET | Freq: Two times a day (BID) | ORAL | 0 refills | Status: DC

## 2019-06-20 NOTE — ED Provider Notes (Signed)
[facility] EMERGENCY DEPARTMENT Provider Note   CSN: [id number] Arrival date & time: 06/19/19  2055     History   Chief Complaint Chief Complaint  Patient presents with   Chest Pain    HPI Oscar Acosta is a 38 y.o. male.     HPI  This is a 38 year old male with no significant past medical history who presents with chest pain.  Patient reports 3-day history of constant right-sided chest pain.  He describes it as pressure and nonradiating.  Currently his pain is 9 out of 10.  Pain is worse with certain positions and movement.  He denies any shortness of breath or fevers.  He does report history of prior pneumothorax which "may have felt the same."  He denies any lower extremity swelling or history of blood clots.  No known history of coronary artery disease, hypertension, hyperlipidemia, diabetes.  He has not taken anything for his pain.  History reviewed. No pertinent past medical history.  There are no active problems to display for this patient.   Past Surgical History:  Procedure Laterality Date   ANOSCOPY  05/12/2018   Procedure: ANOSCOPY;  Surgeon: Fanny Skates, MD;  Location: [facility];  Service: General;;   APPENDECTOMY     INCISION AND DRAINAGE PERIRECTAL ABSCESS N/A 05/12/2018   Procedure: IRRIGATION AND DEBRIDEMENT PERIRECTAL ABSCESS;  Surgeon: Fanny Skates, MD;  Location: [facility];  Service: General;  Laterality: N/A;        Home Medications    Prior to Admission medications   Medication Sig Start Date End Date Taking? Authorizing Provider  cyclobenzaprine (FLEXERIL) 10 MG tablet Take 1 tablet (10 mg total) by mouth 2 (two) times daily as needed for muscle spasms. 07/30/17   Fredia Sorrow, MD  naphazoline (NAPHCON) 0.1 % ophthalmic solution Place 1 drop into the right eye 4 (four) times daily as needed for eye irritation. 12/13/17   Robinson, [place] N, PA-C  naproxen (NAPROSYN) 500 MG tablet Take 1 tablet (500 mg total) by mouth 2 (two)  times daily. 07/30/17   Fredia Sorrow, MD  naproxen (NAPROSYN) 500 MG tablet Take 1 tablet (500 mg total) by mouth 2 (two) times daily. 06/20/19   Diyan Dave, Barbette Hair, MD  phenazopyridine (PYRIDIUM) 200 MG tablet Take 1 tablet (200 mg total) by mouth 3 (three) times daily. 12/13/17   Robinson, [place] N, PA-C    Family History No family history on file.  Social History Social History   Tobacco Use   Smoking status: Current Every Day Smoker    Packs/day: 0.50    Types: Cigarettes   Smokeless tobacco: Never Used  Substance Use Topics   Alcohol use: No   Drug use: No     Allergies   Patient has no known allergies.   Review of Systems Review of Systems  Constitutional: Negative for fever.  Respiratory: Negative for shortness of breath.   Cardiovascular: Positive for chest pain. Negative for leg swelling.  Gastrointestinal: Negative for abdominal pain, nausea and vomiting.  Genitourinary: Negative for dysuria.  All other systems reviewed and are negative.    Physical Exam Updated Vital Signs BP 118/77    Pulse (!) 57    Temp 98.3 F (36.8 C) (Oral)    Resp 18    Ht 1.854 m (6\' 1" )    Wt 90.7 kg    SpO2 99%    BMI 26.39 kg/m   Physical Exam Vitals signs and nursing note reviewed.  Constitutional:  Appearance: He is well-developed.  HENT:     Head: Normocephalic and atraumatic.  Eyes:     Pupils: Pupils are equal, round, and reactive to light.  Neck:     Musculoskeletal: Neck supple.  Cardiovascular:     Rate and Rhythm: Normal rate and regular rhythm.     Heart sounds: Normal heart sounds. No murmur.     Comments: Tenderness to palpation over the right chest wall, no crepitus or overlying skin changes Pulmonary:     Effort: Pulmonary effort is normal. No respiratory distress.     Breath sounds: Normal breath sounds. No wheezing.  Abdominal:     General: Bowel sounds are normal.     Palpations: Abdomen is soft.     Tenderness: There is no abdominal  tenderness. There is no rebound.  Musculoskeletal:     Right lower leg: He exhibits no tenderness. No edema.     Left lower leg: He exhibits no tenderness. No edema.  Lymphadenopathy:     Cervical: No cervical adenopathy.  Skin:    General: Skin is warm and dry.  Neurological:     Mental Status: He is alert and oriented to person, place, and time.  Psychiatric:        Mood and Affect: Mood normal.      ED Treatments / Results  Labs (all labs ordered are listed, but only abnormal results are displayed) Labs Reviewed  BASIC METABOLIC PANEL - Abnormal; Notable for the following components:      Result Value   Potassium 3.3 (*)    CO2 21 (*)    Glucose, Bld 102 (*)    Calcium 8.6 (*)    All other components within normal limits  CBC - Abnormal; Notable for the following components:   HCT 38.6 (*)    All other components within normal limits  TROPONIN I (HIGH SENSITIVITY)  TROPONIN I (HIGH SENSITIVITY)    EKG EKG Interpretation  Date/Time:  Monday June 19 2019 21:12:07 EDT Ventricular Rate:  74 PR Interval:  124 QRS Duration: 94 QT Interval:  380 QTC Calculation: 421 R Axis:   73 Text Interpretation:  Normal sinus rhythm with sinus arrhythmia Minimal voltage criteria for LVH, may be normal variant Nonspecific T wave abnormality Abnormal ECG T wave changes new Confirmed by Ross MarcusHorton, Trinisha Paget ([id number]) on 06/20/2019 12:18:18 AM   Radiology Dg Chest 2 View  Result Date: 06/19/2019 CLINICAL DATA:  Right-sided chest pain and shortness of breath for 4 days. Previous spontaneous pneumothorax. EXAM: CHEST - 2 VIEW COMPARISON:  07/24/2009 FINDINGS: The heart size and mediastinal contours are within normal limits. Both lungs are clear. No evidence of pneumothorax or pleural effusion. The visualized skeletal structures are unremarkable. IMPRESSION: No active cardiopulmonary disease. Electronically Signed   By: Danae OrleansJohn A Stahl M.D.   On: 06/19/2019 21:35    Procedures Procedures  (including critical care time)  Medications Ordered in ED Medications  sodium chloride flush (NS) 0.9 % injection 3 mL (0 mLs Intravenous Hold 06/20/19 0058)  potassium chloride SA (K-DUR) CR tablet 40 mEq (40 mEq Oral Given 06/20/19 0049)  ketorolac (TORADOL) 30 MG/ML injection 30 mg (30 mg Intramuscular Given 06/20/19 0056)     Initial Impression / Assessment and Plan / ED Course  I have reviewed the triage vital signs and the nursing notes.  Pertinent labs & imaging results that were available during my care of the patient were reviewed by me and considered in my medical decision making (see chart  for details).        Patient presents with chest pain.  Overall nontoxic-appearing vital signs are reassuring.  EKG shows no evidence of acute ischemia.  He does have some T wave inversions which are nonspecific.  Chest x-ray without pneumothorax or pneumonia.  Initial troponin negative.  He has reproducible chest pain on exam.  Question musculoskeletal etiology.  He is low risk for PE and is PE RC negative.  Repeat troponin negative.  Patient given Toradol and a dose of potassium.  He was noted to have a slightly low potassium at 3.3.  This could explain his flipped T's.  Patient improved on repeat evaluation.  Recommend naproxen for likely musculoskeletal pain versus costochondritis.  After history, exam, and medical workup I feel the patient has been appropriately medically screened and is safe for discharge home. Pertinent diagnoses were discussed with the patient. Patient was given return precautions.   Final Clinical Impressions(s) / ED Diagnoses   Final diagnoses:  Chest wall pain  Hypokalemia    ED Discharge Orders         Ordered    naproxen (NAPROSYN) 500 MG tablet  2 times daily     06/20/19 0136           Abrian Hanover, Mayer Masker, MD 06/20/19 0139

## 2019-06-20 NOTE — Discharge Instructions (Addendum)
You were seen today for chest pain.  Your work-up was reassuring.  Your potassium was slightly low.  Increase potassium in your diet.  Take naproxen as needed for pain.

## 2019-06-20 NOTE — ED Notes (Signed)
Patient verbalizes understanding of discharge instructions. Opportunity for questioning and answers were provided. Armband removed by staff, pt discharged from ED.  

## 2021-09-15 ENCOUNTER — Other Ambulatory Visit: Payer: Self-pay

## 2021-09-15 ENCOUNTER — Emergency Department (HOSPITAL_COMMUNITY): Payer: BC Managed Care – PPO

## 2021-09-15 ENCOUNTER — Encounter (HOSPITAL_COMMUNITY): Payer: Self-pay

## 2021-09-15 ENCOUNTER — Emergency Department (HOSPITAL_COMMUNITY)
Admission: EM | Admit: 2021-09-15 | Discharge: 2021-09-15 | Disposition: A | Discharge status: Home / Self Care | Payer: BC Managed Care – PPO | Attending: Emergency Medicine | Admitting: Emergency Medicine

## 2021-09-15 DIAGNOSIS — R1031 Right lower quadrant pain: Secondary | ICD-10-CM | POA: Diagnosis not present

## 2021-09-15 DIAGNOSIS — F17210 Nicotine dependence, cigarettes, uncomplicated: Secondary | ICD-10-CM | POA: Diagnosis not present

## 2021-09-15 DIAGNOSIS — M545 Low back pain, unspecified: Secondary | ICD-10-CM | POA: Diagnosis not present

## 2021-09-15 DIAGNOSIS — R0789 Other chest pain: Secondary | ICD-10-CM | POA: Insufficient documentation

## 2021-09-15 LAB — CBC
HCT: 39.4 % (ref 39.0–52.0)
Hemoglobin: 13.4 g/dL (ref 13.0–17.0)
MCH: 28.6 pg (ref 26.0–34.0)
MCHC: 34 g/dL (ref 30.0–36.0)
MCV: 84 fL (ref 80.0–100.0)
Platelets: 227 10*3/uL (ref 150–400)
RBC: 4.69 MIL/uL (ref 4.22–5.81)
RDW: 14.4 % (ref 11.5–15.5)
WBC: 6 10*3/uL (ref 4.0–10.5)
nRBC: 0 % (ref 0.0–0.2)

## 2021-09-15 LAB — COMPREHENSIVE METABOLIC PANEL
ALT: 16 U/L (ref 0–44)
AST: 16 U/L (ref 15–41)
Albumin: 3.4 g/dL — ABNORMAL LOW (ref 3.5–5.0)
Alkaline Phosphatase: 84 U/L (ref 38–126)
Anion gap: 5 (ref 5–15)
BUN: 16 mg/dL (ref 6–20)
CO2: 24 mmol/L (ref 22–32)
Calcium: 8.3 mg/dL — ABNORMAL LOW (ref 8.9–10.3)
Chloride: 110 mmol/L (ref 98–111)
Creatinine, Ser: 0.9 mg/dL (ref 0.61–1.24)
GFR, Estimated: >60 mL/min (ref >60)
Glucose, Bld: 104 mg/dL — ABNORMAL HIGH (ref 70–99)
Potassium: 4.5 mmol/L (ref 3.5–5.1)
Sodium: 139 mmol/L (ref 135–145)
Total Bilirubin: 0.6 mg/dL (ref 0.3–1.2)
Total Protein: 6.7 g/dL (ref 6.5–8.1)

## 2021-09-15 LAB — URINALYSIS, ROUTINE W REFLEX MICROSCOPIC
Bilirubin Urine: NEGATIVE
Glucose, UA: NEGATIVE mg/dL
Hgb urine dipstick: NEGATIVE
Ketones, ur: NEGATIVE mg/dL
Leukocytes,Ua: NEGATIVE
Nitrite: NEGATIVE
Protein, ur: NEGATIVE mg/dL
Specific Gravity, Urine: >1.03 — ABNORMAL HIGH (ref 1.005–1.030)
pH: 6 (ref 5.0–8.0)

## 2021-09-15 MED ORDER — HYDROCODONE-ACETAMINOPHEN 5-325 MG PO TABS
1.0000 | ORAL_TABLET | Freq: Once | ORAL | Status: AC
  Administered 2021-09-15: 1 via ORAL
  Filled 2021-09-15: qty 1

## 2021-09-15 MED ORDER — HYDROMORPHONE HCL 1 MG/ML IJ SOLN
1.0000 mg | Freq: Once | INTRAMUSCULAR | Status: AC
  Administered 2021-09-15: 1 mg via INTRAVENOUS
  Filled 2021-09-15: qty 1

## 2021-09-15 MED ORDER — CYCLOBENZAPRINE HCL 5 MG PO TABS: 5.0000 mg | ORAL_TABLET | Freq: Three times a day (TID) | ORAL | 0 refills | Status: AC | PRN

## 2021-09-15 NOTE — ED Provider Notes (Signed)
Emergency Medicine Provider Triage Evaluation Note  Oscar Acosta , a 40 y.o. male  was evaluated in triage.  Pt complains of right posterior chest wall pain has been ongoing for months but worse over the last 2 days.  Does have a history of pneumothorax in the past.  Pain is worse with deep inspiration and with moving.  No fever or chills.  Review of Systems  Positive:  Negative: See above   Physical Exam  BP 130/69 (BP Location: Right Arm)   Pulse 76   Temp 98.3 F (36.8 C) (Oral)   Resp 18   SpO2 100%  Gen:   Awake, no distress   Resp:  Normal effort, equal breath sounds. MSK:   Moves extremities without difficulty  Other:    Medical Decision Making  Medically screening exam initiated at 5:19 PM.  Appropriate orders placed.  Oscar Acosta was informed that the remainder of the evaluation will be completed by another provider, this initial triage assessment does not replace that evaluation, and the importance of remaining in the ED until their evaluation is complete.     Honor Loh Brodhead, PA-C 09/15/21 1720    Tegeler, Canary Brim, MD 09/15/21 [phone]

## 2021-09-15 NOTE — ED Notes (Signed)
Discharge instructions reviewed with patient. Patient verbalized understanding of instructions. Follow-up care and medications were reviewed. Patient ambulatory with steady gait. VSS upon discharge.  ?

## 2021-09-15 NOTE — ED Provider Notes (Signed)
[facility] EMERGENCY DEPARTMENT Provider Note   CSN: [id number] Arrival date & time: 09/15/21  1619     History Chief Complaint  Patient presents with   Back Pain    Oscar Acosta is a 40 y.o. male who presents to the emergency department with complaints of right flank pain and RLQ tenderness.  He states that he has had back pain for the last couple of months, however it worsened suddenly a few days ago.  He also noticed hematuria at that time.  Had 2 total voids with blood in urine.  No nausea/vomiting.  He describes the pain as a tightness going from the right flank to the RLQ region.  He also describes it as sharp, shooting.  He has to adjust his position in bed to get more comfortable to try to alleviate the pain.  He received pain medication here, however this did not help alleviate symptoms.  He states that he is barely able to sleep because of the pain.  He was seen in urgent care in August at which time seemed that his symptoms were musculoskeletal in nature.   Back Pain Associated symptoms: abdominal pain       History reviewed. No pertinent past medical history.  There are no problems to display for this patient.   Past Surgical History:  Procedure Laterality Date   ANOSCOPY  05/12/2018   Procedure: ANOSCOPY;  Surgeon: Claud Kelp, MD;  Location: [facility];  Service: General;;   APPENDECTOMY     INCISION AND DRAINAGE PERIRECTAL ABSCESS N/A 05/12/2018   Procedure: IRRIGATION AND DEBRIDEMENT PERIRECTAL ABSCESS;  Surgeon: Claud Kelp, MD;  Location: [facility];  Service: General;  Laterality: N/A;       No family history on file.  Social History   Tobacco Use   Smoking status: Every Day    Packs/day: 0.50    Types: Cigarettes   Smokeless tobacco: Never  Substance Use Topics   Alcohol use: No   Drug use: No    Home Medications Prior to Admission medications   Medication Sig Start Date End Date Taking? Authorizing Provider  cyclobenzaprine  (FLEXERIL) 5 MG tablet Take 1 tablet (5 mg total) by mouth 3 (three) times daily as needed for muscle spasms. 09/15/21  Yes Andrey Campanile, MD    Allergies    Patient has no known allergies.  Review of Systems   Review of Systems  Constitutional: Negative.   HENT: Negative.    Eyes: Negative.   Respiratory: Negative.    Cardiovascular: Negative.   Gastrointestinal:  Positive for abdominal pain. Negative for nausea and vomiting.  Endocrine: Negative.   Genitourinary:  Positive for flank pain and hematuria.  Musculoskeletal:  Positive for back pain.  Allergic/Immunologic: Negative.   Hematological: Negative.   Psychiatric/Behavioral: Negative.     Physical Exam Updated Vital Signs BP 130/69 (BP Location: Right Arm)   Pulse 76   Temp 98.3 F (36.8 C) (Oral)   Resp 18   Ht 6\' 1"  (1.854 m)   Wt 85.7 kg   SpO2 100%   BMI 24.94 kg/m   Physical Exam Constitutional:      General: He is in acute distress.  HENT:     Head: Normocephalic and atraumatic.  Eyes:     Extraocular Movements: Extraocular movements intact.  Cardiovascular:     Rate and Rhythm: Normal rate and regular rhythm.     Heart sounds: No murmur heard.   No friction rub. No gallop.  Pulmonary:     Effort: Pulmonary effort is normal.     Breath sounds: Normal breath sounds. No wheezing, rhonchi or rales.  Abdominal:     General: Abdomen is flat.     Tenderness: There is abdominal tenderness. There is right CVA tenderness. There is no left CVA tenderness.  Musculoskeletal:        General: Normal range of motion.  Skin:    General: Skin is warm and dry.  Neurological:     General: No focal deficit present.     Mental Status: He is alert and oriented to person, place, and time. Mental status is at baseline.     Sensory: No sensory deficit.  Psychiatric:        Mood and Affect: Mood normal.        Behavior: Behavior normal.    ED Results / Procedures / Treatments   Labs (all labs ordered are  listed, but only abnormal results are displayed) Labs Reviewed  URINALYSIS, ROUTINE W REFLEX MICROSCOPIC - Abnormal; Notable for the following components:      Result Value   Specific Gravity, Urine >1.030 (*)    All other components within normal limits  COMPREHENSIVE METABOLIC PANEL - Abnormal; Notable for the following components:   Glucose, Bld 104 (*)    Calcium 8.3 (*)    Albumin 3.4 (*)    All other components within normal limits  URINE CULTURE  CBC    EKG None  Radiology DG Chest 2 View  Result Date: 09/15/2021 CLINICAL DATA:  Posterior chest wall pain, tightness radiating to right flank for 1 month EXAM: CHEST - 2 VIEW COMPARISON:  06/19/2019 FINDINGS: Frontal and lateral views of the chest demonstrate an unremarkable cardiac silhouette. No acute airspace disease, effusion, or pneumothorax. No acute bony abnormalities. IMPRESSION: 1. No acute intrathoracic process. Electronically Signed   By: Randa Ngo M.D.   On: 09/15/2021 18:23   CT Renal Stone Study  Result Date: 09/15/2021 CLINICAL DATA:  Back pain, nephrolithiasis EXAM: CT ABDOMEN AND PELVIS WITHOUT CONTRAST TECHNIQUE: Multidetector CT imaging of the abdomen and pelvis was performed following the standard protocol without IV contrast. COMPARISON:  CT pelvis dated 05/12/2018 FINDINGS: Lower chest: Mild subpleural scarring at the lung bases. Hepatobiliary: Unenhanced liver is unremarkable. Gallbladder is unremarkable. No intrahepatic or extrahepatic ductal dilatation. Pancreas: Within normal limits. Spleen: Within normal limits. Adrenals/Urinary Tract: Adrenal glands are within normal limits. 17 mm right upper pole renal cyst (series 3/image 23). Kidneys are otherwise within normal limits. No renal, ureteral, or bladder calculi. No hydronephrosis. Bladder is underdistended but unremarkable. Stomach/Bowel: Stomach is within normal limits. No evidence of bowel obstruction. Appendix is not discretely visualized, reportedly  surgically absent. Linear perianal soft tissue extending from the 5 o'clock position (series 3/image 81) to the right ischiorectal fossa (series 3/images 84 and 91), sequela of prior perianal abscess, without residual fluid collection. Vascular/Lymphatic: No evidence of abdominal aortic aneurysm. Atherosclerotic calcifications of the abdominal aorta and branch vessels. No suspicious abdominopelvic lymphadenopathy. Reproductive: Prostate is unremarkable. Other: No abdominopelvic ascites. Musculoskeletal: No focal osseous lesions. IMPRESSION: No renal, ureteral, or bladder calculi.  No hydronephrosis. Sequela of prior perianal abscess, without residual fluid collection. Electronically Signed   By: Julian Hy M.D.   On: 09/15/2021 23:00    Procedures Procedures   Medications Ordered in ED Medications  HYDROcodone-acetaminophen (NORCO/VICODIN) 5-325 MG per tablet 1 tablet (1 tablet Oral Given 09/15/21 1738)  HYDROmorphone (DILAUDID) injection 1 mg (1 mg Intravenous  Given 09/15/21 2120)    ED Course  I have reviewed the triage vital signs and the nursing notes.  Pertinent labs & imaging results that were available during my care of the patient were reviewed by me and considered in my medical decision making (see chart for details).    MDM Rules/Calculators/A&P                          This is a 40 year old male who presented to the ED with right flank pain radiating to the RLQ as well as couple of episodes of hematuria.  Patient is afebrile and hemodynamically stable at this time.  On exam he does have positive CVA tenderness on the right side as well as RLQ tenderness.  Chest x-ray is unremarkable today for any acute cardiopulmonary processes including pneumothorax.  Labs notable for CBC WNL, creatinine of 0.9, UA is unremarkable for hemoglobin leukocytes, or nitrites.  Differential includes kidney stone versus UTI versus musculoskeletal pain.  We have ordered a CT stone study for further  evaluation. Given dilaudid x 1 for pain.  CT renal stone study shows no evidence of calculi or hydronephrosis, shows a small renal cyst but otherwise unremarkable. Likely musculoskeletal in nature, discussed unremarkable workup with the patient today.  Prescribed Flexeril to take as needed for his back pain.  He was advised to return to the ED for any changing or worsening symptoms.  Final Clinical Impression(s) / ED Diagnoses Final diagnoses:  Acute right-sided low back pain without sciatica    Rx / DC Orders ED Discharge Orders          Ordered    cyclobenzaprine (FLEXERIL) 5 MG tablet  3 times daily PRN        09/15/21 2309             Orvis Brill, MD 09/15/21 2319    Tegeler, Gwenyth Allegra, MD 09/16/21 [phone]

## 2021-09-15 NOTE — Discharge Instructions (Addendum)
You were seen in the emergency department for right lower back pain and abdominal pain.  We got a study of your abdominal region which did not show any kidney stones or other concerning conditions requiring further work-up.  We think this is most likely musculoskeletal in nature and are reassured by your lab work and imaging here.  I have prescribed Flexeril for you.  You may take this up to 3 times daily as needed for your pain.  If any symptoms change or worsen acutely, please return to the nearest emergency department.

## 2021-09-15 NOTE — ED Triage Notes (Signed)
Pt here for back pain that's been ongoing for the past 2 days. Pt stated that he suffers from chronic back pain, but noticed pain as excruciating and unable to sleep or complete ADLs d/t pain. Pain noted to be right lower side and it radiates to RLQ of abdomen. Patient stated that he's been having intermittent numbness and tingling on right side, notably arm, but patient does state the issue has been chronic, just more so worst in the past 48 hours.

## 2021-09-17 LAB — URINE CULTURE: Culture: NO GROWTH

## 2021-12-16 ENCOUNTER — Encounter (HOSPITAL_COMMUNITY): Payer: Self-pay | Admitting: Emergency Medicine

## 2021-12-16 ENCOUNTER — Other Ambulatory Visit: Payer: Self-pay

## 2021-12-16 ENCOUNTER — Emergency Department (HOSPITAL_COMMUNITY)
Admission: EM | Admit: 2021-12-16 | Discharge: 2021-12-17 | Disposition: A | Discharge status: Home / Self Care | Payer: BC Managed Care – PPO | Attending: Emergency Medicine | Admitting: Emergency Medicine

## 2021-12-16 DIAGNOSIS — F17200 Nicotine dependence, unspecified, uncomplicated: Secondary | ICD-10-CM | POA: Diagnosis not present

## 2021-12-16 DIAGNOSIS — M25561 Pain in right knee: Secondary | ICD-10-CM | POA: Insufficient documentation

## 2021-12-16 NOTE — ED Provider Triage Note (Signed)
Emergency Medicine Provider Triage Evaluation Note ? ?Lenise Arena , a 41 y.o. male  was evaluated in triage.  Pt complains of right knee pain started today after he sits up quickly has pain in the lateral aspect of his knee, will feel occasional.  He is moving down his toes, unable to bear weight due to pain.  No other trauma to the area.. ? ?Review of Systems  ?Positive: Right knee pain, paresthesias ?Negative: Calf pain, leg swelling ? ?Physical Exam  ?BP 122/80 (BP Location: Right Arm)   Pulse 86   Temp 98.1 ?F (36.7 ?C) (Oral)   Resp 16   Ht 6\' 1"  (1.854 m)   Wt 83.9 kg   SpO2 100%   BMI 24.41 kg/m?  ?Gen:   Awake, no distress   ?Resp:  Normal effort  ?MSK:   Moves extremities without difficulty  ?Other:   ? ?Medical Decision Making  ?Medically screening exam initiated at 11:25 PM.  Appropriate orders placed.  Vivan Agostino was informed that the remainder of the evaluation will be completed by another provider, this initial triage assessment does not replace that evaluation, and the importance of remaining in the ED until their evaluation is complete. ? ?Presents with right knee pain will need further evaluation. ?  ?Lenise Arena, PA-C ?12/16/21 2325 ? ?

## 2021-12-16 NOTE — ED Triage Notes (Signed)
Pt arrive POV from home for c/o right knee pain since yesterday worse  today, denies any fall or injury. ?

## 2021-12-17 ENCOUNTER — Encounter (HOSPITAL_COMMUNITY): Payer: Self-pay | Admitting: Student

## 2021-12-17 ENCOUNTER — Other Ambulatory Visit (HOSPITAL_BASED_OUTPATIENT_CLINIC_OR_DEPARTMENT_OTHER): Payer: Self-pay

## 2021-12-17 ENCOUNTER — Emergency Department (HOSPITAL_COMMUNITY): Payer: BC Managed Care – PPO

## 2021-12-17 MED ORDER — NAPROXEN 500 MG PO TABS
500.0000 mg | ORAL_TABLET | Freq: Two times a day (BID) | ORAL | 0 refills | Status: AC | PRN
Start: 2021-12-17 — End: ?

## 2021-12-17 MED ORDER — NAPROXEN 500 MG PO TABS
500.0000 mg | ORAL_TABLET | Freq: Two times a day (BID) | ORAL | 0 refills | Status: DC | PRN
  Filled 2021-12-17: qty 15, 8d supply, fill #0

## 2021-12-17 MED ORDER — HYDROCODONE-ACETAMINOPHEN 5-325 MG PO TABS
1.0000 | ORAL_TABLET | Freq: Once | ORAL | Status: AC
  Administered 2021-12-17: 1 via ORAL
  Filled 2021-12-17: qty 1

## 2021-12-17 MED ORDER — KETOROLAC TROMETHAMINE 60 MG/2ML IM SOLN
30.0000 mg | Freq: Once | INTRAMUSCULAR | Status: AC
  Administered 2021-12-17: 30 mg via INTRAMUSCULAR
  Filled 2021-12-17: qty 2

## 2021-12-17 NOTE — Discharge Instructions (Signed)
Please read and follow all provided instructions. ? ?You have been seen today for right knee pain.  ?Please use crutches to help rest.  ? ?Tests performed today include: ?An x-ray of the affected area - does NOT show any broken bones or dislocations.  ?Vital signs. See below for your results today.  ? ?Home care instructions: -- *PRICE in the first 24-48 hours after injury: ?Protect (with brace, splint, sling), if given by your provider ?Rest ?Ice- Do not apply ice pack directly to your skin, place towel or similar between your skin and ice/ice pack. Apply ice for 20 min, then remove for 40 min while awake ?Compression- Wear brace, elastic bandage, splint as directed by your provider ?Elevate affected extremity above the level of your heart when not walking around for the first 24-48 hours  ? ?Medications:  ?- Naproxen is a nonsteroidal anti-inflammatory medication that will help with pain and swelling. Be sure to take this medication as prescribed with food, 1 pill every 12 hours,  It should be taken with food, as it can cause stomach upset, and more seriously, stomach bleeding. Do not take other nonsteroidal anti-inflammatory medications with this such as Advil, Motrin, Aleve, Mobic, Goodie Powder, or Motrin.   ? ?You make take Tylenol per over the counter dosing with these medications.  ? ?We have prescribed you new medication(s) today. Discuss the medications prescribed today with your pharmacist as they can have adverse effects and interactions with your other medicines including over the counter and prescribed medications. Seek medical evaluation if you start to experience new or abnormal symptoms after taking one of these medicines, seek care immediately if you start to experience difficulty breathing, feeling of your throat closing, facial swelling, or rash as these could be indications of a more serious allergic reaction ? ? ?Follow-up instructions: ?Please follow-up with orthopedics- call the office to  schedule an appointment.  ? ?Return instructions:  ?Please return if your digits or extremity are numb or tingling, appear gray or blue, or you have severe pain (also elevate the extremity and loosen splint or wrap if you were given one) ?Please return if you have redness or fevers.  ?Please return to the Emergency Department if you experience worsening symptoms.  ?Please return if you have any other emergent concerns. ?Additional Information: ? ?Your vital signs today were: ?BP 122/80 (BP Location: Right Arm)   Pulse 86   Temp 98.1 ?F (36.7 ?C) (Oral)   Resp 16   Ht 6\' 1"  (1.854 m)   Wt 83.9 kg   SpO2 100%   BMI 24.41 kg/m?  ?If your blood pressure (BP) was elevated above 135/85 this visit, please have this repeated by your doctor within one month. ?---------------  ?

## 2021-12-17 NOTE — ED Provider Notes (Signed)
?[facility] EMERGENCY DEPARTMENT ?Provider Note ? ? ?CSN: [id number] ?Arrival date & time: 12/16/21  2255 ? ?  ? ?History ? ?Chief Complaint  ?Patient presents with  ? Knee Pain  ? ? ?Oscar Acosta is a 41 y.o. male with history of tobacco use who presents to the emergency department with complaints of right knee pain since yesterday.  Patient states that he was bending down to plug in a light when he felt a cramp in his knee with a popping/tearing sensation.  He has had fairly significant pain since this, mostly to the anterior and medial knee, worse with certain movements and positions, no alleviating factors.  Having intermittent paresthesias into the leg.  Denies complete numbness, weakness, or other areas of injury. ? ?HPI ? ?  ? ?Home Medications ?Prior to Admission medications   ?Medication Sig Start Date End Date Taking? Authorizing Provider  ?cyclobenzaprine (FLEXERIL) 5 MG tablet Take 1 tablet (5 mg total) by mouth 3 (three) times daily as needed for muscle spasms. 09/15/21   Andrey Campanile, MD  ?   ? ?Allergies    ?Patient has no known allergies.   ? ?Review of Systems   ?Review of Systems  ?Constitutional:  Negative for fever.  ?Musculoskeletal:  Positive for arthralgias.  ?Neurological:  Negative for weakness and numbness.  ?     Positive for intermittent paresthesias.   ?All other systems reviewed and are negative. ? ?Physical Exam ?Updated Vital Signs ?BP 122/80 (BP Location: Right Arm)   Pulse 86   Temp 98.1 ?F (36.7 ?C) (Oral)   Resp 16   Ht 6\' 1"  (1.854 m)   Wt 83.9 kg   SpO2 100%   BMI 24.41 kg/m?  ?Physical Exam ?Vitals and nursing note reviewed.  ?Constitutional:   ?   General: He is not in acute distress. ?   Appearance: He is not ill-appearing or toxic-appearing.  ?HENT:  ?   Head: Normocephalic and atraumatic.  ?Cardiovascular:  ?   Rate and Rhythm: Normal rate and regular rhythm.  ?   Pulses:     ?     Dorsalis pedis pulses are 2+ on the right side and 2+ on the  left side.  ?     Posterior tibial pulses are 2+ on the right side and 2+ on the left side.  ?Pulmonary:  ?   Effort: Pulmonary effort is normal.  ?Musculoskeletal:  ?   Comments: Lower extremities: No obvious deformity, appreciable swelling, edema, erythema, ecchymosis, warmth, or open wounds. Patient has intact AROM to bilateral hips, knees, ankles, and all digits, pain with full extension of the right knee. Tender to palpation the anterior right knee including the quadriceps and patellar tendon as well as the patella, also tender over the medial joint line.  Otherwise nontender.  No calf tenderness.  Compartments are soft.  Able to flex and extend the knee against resistance and flex and extend the hip against resistance.   ?Skin: ?   General: Skin is warm and dry.  ?   Capillary Refill: Capillary refill takes less than 2 seconds.  ?Neurological:  ?   Mental Status: He is alert.  ?   Comments: Alert. Clear speech. Sensation grossly intact to bilateral lower extremities. 5/5 strength with plantar/dorsiflexion bilaterally.   ?Psychiatric:     ?   Mood and Affect: Mood normal.     ?   Behavior: Behavior normal.  ? ? ?ED Results / Procedures / Treatments   ?  Labs ?(all labs ordered are listed, but only abnormal results are displayed) ?Labs Reviewed - No data to display ? ?EKG ?None ? ?Radiology ?DG Knee Complete 4 Views Right ? ?Result Date: 12/17/2021 ?CLINICAL DATA:  Pain over the lateral tibial plateau. EXAM: RIGHT KNEE - COMPLETE 4+ VIEW COMPARISON:  Right knee radiograph dated 12/31/2013. FINDINGS: No evidence of fracture, dislocation, or joint effusion. No evidence of arthropathy or other focal bone abnormality. Soft tissues are unremarkable. IMPRESSION: Negative. Electronically Signed   By: Elgie Collard M.D.   On: 12/17/2021 00:25   ? ?Procedures ?Procedures  ? ? ?Medications Ordered in ED ?Medications  ?ketorolac (TORADOL) injection 30 mg (30 mg Intramuscular Given 12/17/21 0334)  ?HYDROcodone-acetaminophen  (NORCO/VICODIN) 5-325 MG per tablet 1 tablet (1 tablet Oral Given 12/17/21 0334)  ? ? ?ED Course/ Medical Decision Making/ A&P ?  ?                        ?Medical Decision Making ? ?Patient presents to the ED with complaints of right knee pain,. Nontoxic, vitals WNL ? ?Additional history obtained:  ?Chart & nursing note reviewed.  ?Last creatinine WNL ? ?Imaging Studies ordered:  ?I viewed the following imaging, agree with radiologist impression:  ?Right knee xray: Negative ? ?ED Course:  ?Xray w/o fx/dislocation.  ?Able to flex/extend the hip & knee against resistance therefore do not suspect complete quadriceps/patella tendon tear. NVI distally with 2+ pulses. NO calf tenderness. No signs of infection. Tender anteriorly and to medial joint line- question possible ligamentous/meniscal injury, patient uncomfortable with knee in full extension therefore was not placed in knee immobilizer, placed in ace wrap, crutches provided, PRICE discussed and naproxen prescribed. Will have him follow up closely with orthopedics. I discussed results, treatment plan, need for follow-up, and return precautions with the patient. Provided opportunity for questions, patient confirmed understanding and is in agreement with plan.  ? ?Portions of this note were generated with Scientist, clinical (histocompatibility and immunogenetics). Dictation errors may occur despite best attempts at proofreading. ? ? ?Final Clinical Impression(s) / ED Diagnoses ?Final diagnoses:  ?Acute pain of right knee  ? ? ?Rx / DC Orders ?ED Discharge Orders   ? ? None  ? ?  ? ? ?  ?Cherly Anderson, PA-C ?12/17/21 0408 ? ?  ?Nira Conn, MD ?12/17/21 [phone] ? ?

## 2022-01-28 ENCOUNTER — Emergency Department (HOSPITAL_COMMUNITY)
Admission: EM | Admit: 2022-01-28 | Discharge: 2022-01-29 | Disposition: A | Discharge status: Home / Self Care | Payer: BC Managed Care – PPO | Attending: Emergency Medicine | Admitting: Emergency Medicine

## 2022-01-28 DIAGNOSIS — J069 Acute upper respiratory infection, unspecified: Secondary | ICD-10-CM | POA: Diagnosis not present

## 2022-01-28 DIAGNOSIS — Z20822 Contact with and (suspected) exposure to covid-19: Secondary | ICD-10-CM | POA: Diagnosis not present

## 2022-01-28 DIAGNOSIS — R5381 Other malaise: Secondary | ICD-10-CM | POA: Diagnosis not present

## 2022-01-28 DIAGNOSIS — R509 Fever, unspecified: Secondary | ICD-10-CM | POA: Diagnosis present

## 2022-01-28 DIAGNOSIS — M791 Myalgia, unspecified site: Secondary | ICD-10-CM | POA: Diagnosis not present

## 2022-01-29 ENCOUNTER — Encounter (HOSPITAL_COMMUNITY): Payer: Self-pay | Admitting: Emergency Medicine

## 2022-01-29 ENCOUNTER — Other Ambulatory Visit: Payer: Self-pay

## 2022-01-29 ENCOUNTER — Emergency Department (HOSPITAL_COMMUNITY): Payer: BC Managed Care – PPO

## 2022-01-29 LAB — BASIC METABOLIC PANEL
Anion gap: 10 (ref 5–15)
BUN: 13 mg/dL (ref 6–20)
CO2: 21 mmol/L — ABNORMAL LOW (ref 22–32)
Calcium: 8.8 mg/dL — ABNORMAL LOW (ref 8.9–10.3)
Chloride: 107 mmol/L (ref 98–111)
Creatinine, Ser: 0.81 mg/dL (ref 0.61–1.24)
GFR, Estimated: >60 mL/min (ref >60)
Glucose, Bld: 98 mg/dL (ref 70–99)
Potassium: 4 mmol/L (ref 3.5–5.1)
Sodium: 138 mmol/L (ref 135–145)

## 2022-01-29 LAB — CBC WITH DIFFERENTIAL/PLATELET
Abs Immature Granulocytes: 0.03 10*3/uL (ref 0.00–0.07)
Basophils Absolute: 0.1 10*3/uL (ref 0.0–0.1)
Basophils Relative: 1 %
Eosinophils Absolute: 0.1 10*3/uL (ref 0.0–0.5)
Eosinophils Relative: 1 %
HCT: 41.9 % (ref 39.0–52.0)
Hemoglobin: 14.4 g/dL (ref 13.0–17.0)
Immature Granulocytes: 0 %
Lymphocytes Relative: 13 %
Lymphs Abs: 0.9 10*3/uL (ref 0.7–4.0)
MCH: 29.1 pg (ref 26.0–34.0)
MCHC: 34.4 g/dL (ref 30.0–36.0)
MCV: 84.8 fL (ref 80.0–100.0)
Monocytes Absolute: 0.6 10*3/uL (ref 0.1–1.0)
Monocytes Relative: 9 %
Neutro Abs: 5.3 10*3/uL (ref 1.7–7.7)
Neutrophils Relative %: 76 %
Platelets: 276 10*3/uL (ref 150–400)
RBC: 4.94 MIL/uL (ref 4.22–5.81)
RDW: 14.5 % (ref 11.5–15.5)
WBC: 7 10*3/uL (ref 4.0–10.5)
nRBC: 0 % (ref 0.0–0.2)

## 2022-01-29 LAB — RESP PANEL BY RT-PCR (FLU A&B, COVID) ARPGX2
Influenza A by PCR: NEGATIVE
Influenza B by PCR: NEGATIVE
SARS Coronavirus 2 by RT PCR: NEGATIVE

## 2022-01-29 MED ORDER — PREDNISONE 20 MG PO TABS: 40.0000 mg | ORAL_TABLET | Freq: Every day | ORAL | 0 refills | Status: AC

## 2022-01-29 MED ORDER — ACETAMINOPHEN 325 MG PO TABS
650.0000 mg | ORAL_TABLET | Freq: Once | ORAL | Status: AC
Start: 2022-01-29 — End: 2022-01-29
  Administered 2022-01-29: 650 mg via ORAL
  Filled 2022-01-29: qty 2

## 2022-01-29 MED ORDER — BENZONATATE 200 MG PO CAPS: 200.0000 mg | ORAL_CAPSULE | Freq: Three times a day (TID) | ORAL | 0 refills | Status: AC | PRN

## 2022-01-29 MED ORDER — ALBUTEROL SULFATE HFA 108 (90 BASE) MCG/ACT IN AERS: 2.0000 | INHALATION_SPRAY | RESPIRATORY_TRACT | 0 refills | Status: AC | PRN

## 2022-01-29 NOTE — ED Provider Triage Note (Signed)
Emergency Medicine Provider Triage Evaluation Note ? ?Oscar Acosta , a 41 y.o. male  was evaluated in triage.  Pt complains of URI-like symptoms, started on Saturday to get back from [facility], endorses a headache, congestion, productive cough, general body aches, denies any stomach pain nausea vomiting diarrhea, not vaccinated COVID or influenza, denies any recent sick contacts.  Not immunocompromise. ? ?Review of Systems  ?Positive: Body aches, congestion ?Negative: Chest pain, shortness of breath ? ?Physical Exam  ?BP 125/77 (BP Location: Right Arm)   Pulse (!) 103   Temp (!) 102.1 ?F (38.9 ?C) (Oral)   Resp 20   SpO2 97%  ?Gen:   Awake, no distress   ?Resp:  Normal effort  ?MSK:   Moves extremities without difficulty  ?Other:   ? ?Medical Decision Making  ?Medically screening exam initiated at 12:50 AM.  Appropriate orders placed.  [name] was informed that the remainder of the evaluation will be completed by another provider, this initial triage assessment does not replace that evaluation, and the importance of remaining in the ED until their evaluation is complete. ? ?Presents with URI-like symptoms laboratory tomorrow  will need further work-up. ?  ?[name], PA-C ?01/29/22 1638 ? ?

## 2022-01-29 NOTE — ED Triage Notes (Signed)
Pt reports feeling badly, generalized body aches, headache, exhaustion, feeling hot.  NO OTC medications. ?

## 2022-01-29 NOTE — ED Provider Notes (Signed)
?[facility] EMERGENCY DEPARTMENT ?Provider Note ? ? ?CSN: [id number] ?Arrival date & time: 01/28/22  2308 ? ?  ? ?History ? ?Chief Complaint  ?Patient presents with  ? Fever  ? Generalized Body Aches  ? ? ?Oscar Acosta is a 41 y.o. male. ? ?Patient presents to the emergency department for evaluation of malaise, body aches, fever, chills, sore throat, nausea with cough and chest congestion.  Symptoms present for 2 days. ? ? ?  ? ?Home Medications ?Prior to Admission medications   ?Medication Sig Start Date End Date Taking? Authorizing Provider  ?albuterol (VENTOLIN HFA) 108 (90 Base) MCG/ACT inhaler Inhale 2 puffs into the lungs every 4 (four) hours as needed for wheezing or shortness of breath. 01/29/22  Yes Calvina Liptak, Canary Brim, MD  ?benzonatate (TESSALON) 200 MG capsule Take 1 capsule (200 mg total) by mouth 3 (three) times daily as needed for cough. 01/29/22  Yes Tekoa Hamor, Canary Brim, MD  ?predniSONE (DELTASONE) 20 MG tablet Take 2 tablets (40 mg total) by mouth daily with breakfast. 01/29/22  Yes Lilyian Quayle, Canary Brim, MD  ?cyclobenzaprine (FLEXERIL) 5 MG tablet Take 1 tablet (5 mg total) by mouth 3 (three) times daily as needed for muscle spasms. 09/15/21   Andrey Campanile, MD  ?naproxen (NAPROSYN) 500 MG tablet Take 1 tablet (500 mg total) by mouth 2 (two) times daily as needed for moderate pain. 12/17/21   Petrucelli, Pleas Koch, PA-C  ?   ? ?Allergies    ?Patient has no known allergies.   ? ?Review of Systems   ?Review of Systems  ?Constitutional:  Positive for chills and fever.  ?HENT:  Positive for congestion and sore throat.   ?Respiratory:  Positive for cough.   ? ?Physical Exam ?Updated Vital Signs ?BP 121/80   Pulse 71   Temp 98.3 ?F (36.8 ?C) (Oral)   Resp 18   SpO2 99%  ?Physical Exam ?Vitals and nursing note reviewed.  ?Constitutional:   ?   General: He is not in acute distress. ?   Appearance: He is well-developed.  ?HENT:  ?   Head: Normocephalic and atraumatic.  ?    Mouth/Throat:  ?   Mouth: Mucous membranes are moist.  ?Eyes:  ?   General: Vision grossly intact. Gaze aligned appropriately.  ?   Extraocular Movements: Extraocular movements intact.  ?   Conjunctiva/sclera: Conjunctivae normal.  ?Cardiovascular:  ?   Rate and Rhythm: Normal rate and regular rhythm.  ?   Pulses: Normal pulses.  ?   Heart sounds: Normal heart sounds, S1 normal and S2 normal. No murmur heard. ?  No friction rub. No gallop.  ?Pulmonary:  ?   Effort: Pulmonary effort is normal. No respiratory distress.  ?   Breath sounds: Normal breath sounds.  ?Abdominal:  ?   Palpations: Abdomen is soft.  ?   Tenderness: There is no abdominal tenderness. There is no guarding or rebound.  ?   Hernia: No hernia is present.  ?Musculoskeletal:     ?   General: No swelling.  ?   Cervical back: Full passive range of motion without pain, normal range of motion and neck supple. No pain with movement, spinous process tenderness or muscular tenderness. Normal range of motion.  ?   Right lower leg: No edema.  ?   Left lower leg: No edema.  ?Skin: ?   General: Skin is warm and dry.  ?   Capillary Refill: Capillary refill takes less than 2 seconds.  ?  Findings: No ecchymosis, erythema, lesion or wound.  ?Neurological:  ?   Mental Status: He is alert and oriented to person, place, and time.  ?   GCS: GCS eye subscore is 4. GCS verbal subscore is 5. GCS motor subscore is 6.  ?   Cranial Nerves: Cranial nerves 2-12 are intact.  ?   Sensory: Sensation is intact.  ?   Motor: Motor function is intact. No weakness or abnormal muscle tone.  ?   Coordination: Coordination is intact.  ?Psychiatric:     ?   Mood and Affect: Mood normal.     ?   Speech: Speech normal.     ?   Behavior: Behavior normal.  ? ? ?ED Results / Procedures / Treatments   ?Labs ?(all labs ordered are listed, but only abnormal results are displayed) ?Labs Reviewed  ?BASIC METABOLIC PANEL - Abnormal; Notable for the following components:  ?    Result Value  ? CO2 21  (*)   ? Calcium 8.8 (*)   ? All other components within normal limits  ?RESP PANEL BY RT-PCR (FLU A&B, COVID) ARPGX2  ?CBC WITH DIFFERENTIAL/PLATELET  ? ? ?EKG ?None ? ?Radiology ?DG Chest Portable 1 View ? ?Result Date: 01/29/2022 ?CLINICAL DATA:  Cough, fever, body aches EXAM: PORTABLE CHEST 1 VIEW COMPARISON:  09/15/2021 FINDINGS: The heart size and mediastinal contours are within normal limits. Both lungs are clear. The visualized skeletal structures are unremarkable. IMPRESSION: Normal study. Electronically Signed   By: Charlett Nose M.D.   On: 01/29/2022 01:15   ? ?Procedures ?Procedures  ? ? ?Medications Ordered in ED ?Medications  ?acetaminophen (TYLENOL) tablet 650 mg (650 mg Oral Given 01/29/22 0101)  ? ? ?ED Course/ Medical Decision Making/ A&P ?  ?                        ?Medical Decision Making ? ?Presents with URI symptoms.  Patient appears well.  Vital signs reveal fever and slight tachycardia which resolved with antipyretics.  Differential diagnosis considered includes pneumonia, COVID, influenza, viral bronchitis, strep throat. ? ?Blood work is unremarkable.  COVID-negative, influenza negative.  Doubt strep as he has other URI symptoms.  Patient with cough and chest congestion but chest x-ray is clear.  Will treat symptomatically. ? ? ? ? ? ? ? ?Final Clinical Impression(s) / ED Diagnoses ?Final diagnoses:  ?Viral upper respiratory tract infection  ? ? ?Rx / DC Orders ?ED Discharge Orders   ? ?      Ordered  ?  predniSONE (DELTASONE) 20 MG tablet  Daily with breakfast       ? 01/29/22 0600  ?  benzonatate (TESSALON) 200 MG capsule  3 times daily PRN       ? 01/29/22 0600  ?  albuterol (VENTOLIN HFA) 108 (90 Base) MCG/ACT inhaler  Every 4 hours PRN       ? 01/29/22 0600  ? ?  ?  ? ?  ? ? ?  ?Gilda Crease, MD ?01/29/22 0600 ? ?

## 2022-01-29 NOTE — ED Notes (Signed)
Pt endorses body aches, chills, sore throat, nausea 2 days.  ?

## 2022-07-10 ENCOUNTER — Other Ambulatory Visit (HOSPITAL_COMMUNITY): Payer: Self-pay

## 2022-12-25 ENCOUNTER — Encounter (HOSPITAL_COMMUNITY)
Admission: EM | Disposition: A | Discharge status: Home / Self Care | Payer: Self-pay | Source: Home / Self Care | Attending: Emergency Medicine

## 2022-12-25 ENCOUNTER — Emergency Department (HOSPITAL_COMMUNITY): Payer: Self-pay | Admitting: Certified Registered Nurse Anesthetist

## 2022-12-25 ENCOUNTER — Other Ambulatory Visit: Payer: Self-pay

## 2022-12-25 ENCOUNTER — Emergency Department (EMERGENCY_DEPARTMENT_HOSPITAL): Payer: Self-pay | Admitting: Certified Registered Nurse Anesthetist

## 2022-12-25 ENCOUNTER — Encounter (HOSPITAL_COMMUNITY): Payer: Self-pay | Admitting: Emergency Medicine

## 2022-12-25 ENCOUNTER — Ambulatory Visit (HOSPITAL_COMMUNITY)
Admission: EM | Admit: 2022-12-25 | Discharge: 2022-12-25 | Disposition: A | Discharge status: Home / Self Care | Payer: Self-pay | Attending: Surgery | Admitting: Surgery

## 2022-12-25 ENCOUNTER — Emergency Department (HOSPITAL_COMMUNITY): Payer: Self-pay

## 2022-12-25 DIAGNOSIS — K610 Anal abscess: Secondary | ICD-10-CM | POA: Insufficient documentation

## 2022-12-25 DIAGNOSIS — F17210 Nicotine dependence, cigarettes, uncomplicated: Secondary | ICD-10-CM | POA: Insufficient documentation

## 2022-12-25 HISTORY — PX: INCISION AND DRAINAGE PERIRECTAL ABSCESS: SHX1804

## 2022-12-25 LAB — I-STAT CHEM 8, ED
BUN: 19 mg/dL (ref 6–20)
Calcium, Ion: 1.14 mmol/L — ABNORMAL LOW (ref 1.15–1.40)
Chloride: 104 mmol/L (ref 98–111)
Creatinine, Ser: 1 mg/dL (ref 0.61–1.24)
Glucose, Bld: 90 mg/dL (ref 70–99)
HCT: 43 % (ref 39.0–52.0)
Hemoglobin: 14.6 g/dL (ref 13.0–17.0)
Potassium: 3.7 mmol/L (ref 3.5–5.1)
Sodium: 141 mmol/L (ref 135–145)
TCO2: 25 mmol/L (ref 22–32)

## 2022-12-25 LAB — NO BLOOD PRODUCTS

## 2022-12-25 SURGERY — INCISION AND DRAINAGE, ABSCESS, PERIRECTAL
Anesthesia: General

## 2022-12-25 MED ORDER — OXYCODONE HCL 5 MG PO TABS: 5.0000 mg | ORAL_TABLET | Freq: Four times a day (QID) | ORAL | 0 refills | Status: AC | PRN

## 2022-12-25 MED ORDER — PROMETHAZINE HCL 25 MG/ML IJ SOLN: 6.2500 mg | INTRAMUSCULAR | Status: DC | PRN

## 2022-12-25 MED ORDER — IOHEXOL 350 MG/ML SOLN
75.0000 mL | Freq: Once | INTRAVENOUS | Status: AC | PRN
  Administered 2022-12-25: 75 mL via INTRAVENOUS

## 2022-12-25 MED ORDER — SODIUM CHLORIDE 0.45 % IV SOLN: INTRAVENOUS | Status: DC

## 2022-12-25 MED ORDER — 0.9 % SODIUM CHLORIDE (POUR BTL) OPTIME
TOPICAL | Status: DC | PRN
  Administered 2022-12-25: 1000 mL

## 2022-12-25 MED ORDER — ORAL CARE MOUTH RINSE: 15.0000 mL | Freq: Once | OROMUCOSAL | Status: AC

## 2022-12-25 MED ORDER — HYDROMORPHONE HCL 1 MG/ML IJ SOLN
1.0000 mg | Freq: Once | INTRAMUSCULAR | Status: AC
  Administered 2022-12-25: 1 mg via INTRAVENOUS
  Filled 2022-12-25: qty 1

## 2022-12-25 MED ORDER — LIDOCAINE 2% (20 MG/ML) 5 ML SYRINGE
INTRAMUSCULAR | Status: DC | PRN
  Administered 2022-12-25: 60 mg via INTRAVENOUS

## 2022-12-25 MED ORDER — KETOROLAC TROMETHAMINE 15 MG/ML IJ SOLN
15.0000 mg | Freq: Once | INTRAMUSCULAR | Status: AC
  Administered 2022-12-25: 15 mg via INTRAVENOUS
  Filled 2022-12-25: qty 1

## 2022-12-25 MED ORDER — LIDOCAINE 2% (20 MG/ML) 5 ML SYRINGE
INTRAMUSCULAR | Status: AC
  Filled 2022-12-25: qty 5

## 2022-12-25 MED ORDER — ONDANSETRON HCL 4 MG/2ML IJ SOLN: 4.0000 mg | Freq: Four times a day (QID) | INTRAMUSCULAR | Status: DC | PRN

## 2022-12-25 MED ORDER — DEXAMETHASONE SODIUM PHOSPHATE 10 MG/ML IJ SOLN
INTRAMUSCULAR | Status: AC
  Filled 2022-12-25: qty 1

## 2022-12-25 MED ORDER — SODIUM CHLORIDE 0.9 % IV BOLUS
1000.0000 mL | Freq: Once | INTRAVENOUS | Status: AC
  Administered 2022-12-25: 1000 mL via INTRAVENOUS

## 2022-12-25 MED ORDER — CHLORHEXIDINE GLUCONATE 0.12 % MT SOLN: 15.0000 mL | Freq: Once | OROMUCOSAL | Status: AC

## 2022-12-25 MED ORDER — BUPIVACAINE HCL (PF) 0.25 % IJ SOLN
INTRAMUSCULAR | Status: AC
  Filled 2022-12-25: qty 30

## 2022-12-25 MED ORDER — BUPIVACAINE HCL (PF) 0.25 % IJ SOLN
INTRAMUSCULAR | Status: DC | PRN
  Administered 2022-12-25: 10 mL

## 2022-12-25 MED ORDER — ROCURONIUM BROMIDE 10 MG/ML (PF) SYRINGE
PREFILLED_SYRINGE | INTRAVENOUS | Status: AC
  Filled 2022-12-25: qty 10

## 2022-12-25 MED ORDER — PROPOFOL 10 MG/ML IV BOLUS
INTRAVENOUS | Status: DC | PRN
  Administered 2022-12-25: 200 mg via INTRAVENOUS

## 2022-12-25 MED ORDER — PROPOFOL 10 MG/ML IV BOLUS
INTRAVENOUS | Status: AC
  Filled 2022-12-25: qty 40

## 2022-12-25 MED ORDER — FENTANYL CITRATE (PF) 250 MCG/5ML IJ SOLN
INTRAMUSCULAR | Status: DC | PRN
  Administered 2022-12-25: 50 ug via INTRAVENOUS
  Administered 2022-12-25 (×2): 100 ug via INTRAVENOUS

## 2022-12-25 MED ORDER — HYDROMORPHONE HCL 1 MG/ML IJ SOLN: 0.5000 mg | INTRAMUSCULAR | Status: DC | PRN

## 2022-12-25 MED ORDER — ONDANSETRON HCL 4 MG/2ML IJ SOLN
4.0000 mg | Freq: Once | INTRAMUSCULAR | Status: AC
  Administered 2022-12-25: 4 mg via INTRAVENOUS
  Filled 2022-12-25: qty 2

## 2022-12-25 MED ORDER — CHLORHEXIDINE GLUCONATE 0.12 % MT SOLN
OROMUCOSAL | Status: AC
  Administered 2022-12-25: 15 mL via OROMUCOSAL
  Filled 2022-12-25: qty 15

## 2022-12-25 MED ORDER — OXYCODONE HCL 5 MG PO TABS: 5.0000 mg | ORAL_TABLET | Freq: Once | ORAL | Status: DC | PRN

## 2022-12-25 MED ORDER — DEXAMETHASONE SODIUM PHOSPHATE 10 MG/ML IJ SOLN
INTRAMUSCULAR | Status: DC | PRN
  Administered 2022-12-25: 8 mg via INTRAVENOUS

## 2022-12-25 MED ORDER — ONDANSETRON HCL 4 MG/2ML IJ SOLN
INTRAMUSCULAR | Status: DC | PRN
  Administered 2022-12-25: 4 mg via INTRAVENOUS

## 2022-12-25 MED ORDER — SUCCINYLCHOLINE CHLORIDE 200 MG/10ML IV SOSY
PREFILLED_SYRINGE | INTRAVENOUS | Status: DC | PRN
  Administered 2022-12-25: 140 mg via INTRAVENOUS

## 2022-12-25 MED ORDER — ONDANSETRON HCL 4 MG/2ML IJ SOLN
INTRAMUSCULAR | Status: AC
  Filled 2022-12-25: qty 2

## 2022-12-25 MED ORDER — PIPERACILLIN-TAZOBACTAM 3.375 G IVPB 30 MIN
3.3750 g | Freq: Once | INTRAVENOUS | Status: AC
  Administered 2022-12-25: 3.375 g via INTRAVENOUS
  Filled 2022-12-25: qty 50

## 2022-12-25 MED ORDER — LACTATED RINGERS IV SOLN: INTRAVENOUS | Status: DC

## 2022-12-25 MED ORDER — MIDAZOLAM HCL 2 MG/2ML IJ SOLN
INTRAMUSCULAR | Status: DC | PRN
  Administered 2022-12-25: 2 mg via INTRAVENOUS

## 2022-12-25 MED ORDER — OXYCODONE-ACETAMINOPHEN 5-325 MG PO TABS: 2.0000 | ORAL_TABLET | Freq: Once | ORAL | Status: DC

## 2022-12-25 MED ORDER — DEXMEDETOMIDINE HCL IN NACL 80 MCG/20ML IV SOLN
INTRAVENOUS | Status: DC | PRN
  Administered 2022-12-25 (×2): 8 ug via BUCCAL

## 2022-12-25 MED ORDER — FENTANYL CITRATE (PF) 100 MCG/2ML IJ SOLN: 25.0000 ug | INTRAMUSCULAR | Status: DC | PRN

## 2022-12-25 MED ORDER — ROCURONIUM BROMIDE 10 MG/ML (PF) SYRINGE
PREFILLED_SYRINGE | INTRAVENOUS | Status: DC | PRN
  Administered 2022-12-25: 60 mg via INTRAVENOUS

## 2022-12-25 MED ORDER — FENTANYL CITRATE (PF) 250 MCG/5ML IJ SOLN
INTRAMUSCULAR | Status: AC
  Filled 2022-12-25: qty 5

## 2022-12-25 MED ORDER — LIDOCAINE-EPINEPHRINE (PF) 2 %-1:200000 IJ SOLN
20.0000 mL | Freq: Once | INTRAMUSCULAR | Status: AC
  Administered 2022-12-25: 20 mL via INTRADERMAL
  Filled 2022-12-25: qty 20

## 2022-12-25 MED ORDER — OXYCODONE HCL 5 MG/5ML PO SOLN: 5.0000 mg | Freq: Once | ORAL | Status: DC | PRN

## 2022-12-25 MED ORDER — SUGAMMADEX SODIUM 200 MG/2ML IV SOLN
INTRAVENOUS | Status: DC | PRN
  Administered 2022-12-25: 345 mg via INTRAVENOUS

## 2022-12-25 MED ORDER — HYDROMORPHONE HCL 1 MG/ML IJ SOLN: 2.0000 mg | Freq: Once | INTRAMUSCULAR | Status: DC

## 2022-12-25 MED ORDER — MIDAZOLAM HCL 2 MG/2ML IJ SOLN
INTRAMUSCULAR | Status: AC
  Filled 2022-12-25: qty 2

## 2022-12-25 SURGICAL SUPPLY — 33 items
BAG COUNTER SPONGE SURGICOUNT (BAG) ×1 IMPLANT
BAG SPNG CNTER NS LX DISP (BAG) ×1
BNDG GAUZE DERMACEA FLUFF 4 (GAUZE/BANDAGES/DRESSINGS) IMPLANT
BNDG GZE DERMACEA 4 6PLY (GAUZE/BANDAGES/DRESSINGS)
COVER MAYO STAND STRL (DRAPES) ×1 IMPLANT
COVER SURGICAL LIGHT HANDLE (MISCELLANEOUS) ×1 IMPLANT
DRAIN PENROSE 12X.25 LTX STRL (MISCELLANEOUS) IMPLANT
ELECT CAUTERY BLADE 6.4 (BLADE) ×1 IMPLANT
ELECT REM PT RETURN 9FT ADLT (ELECTROSURGICAL) ×1
ELECTRODE REM PT RTRN 9FT ADLT (ELECTROSURGICAL) ×1 IMPLANT
GAUZE PAD ABD 8X10 STRL (GAUZE/BANDAGES/DRESSINGS) IMPLANT
GAUZE SPONGE 2X2 8PLY STRL LF (GAUZE/BANDAGES/DRESSINGS) IMPLANT
GAUZE SPONGE 4X4 12PLY STRL (GAUZE/BANDAGES/DRESSINGS) IMPLANT
GLOVE BIO SURGEON STRL SZ7 (GLOVE) ×1 IMPLANT
GLOVE BIOGEL PI IND STRL 7.5 (GLOVE) ×1 IMPLANT
GOWN STRL REUS W/ TWL LRG LVL3 (GOWN DISPOSABLE) ×2 IMPLANT
GOWN STRL REUS W/TWL LRG LVL3 (GOWN DISPOSABLE) ×2
KIT BASIN OR (CUSTOM PROCEDURE TRAY) ×1 IMPLANT
KIT TURNOVER KIT B (KITS) ×1 IMPLANT
LEGGING LITHOTOMY PAIR STRL (DRAPES) IMPLANT
NS IRRIG 1000ML POUR BTL (IV SOLUTION) ×1 IMPLANT
PACK GENERAL/GYN (CUSTOM PROCEDURE TRAY) IMPLANT
PACK LITHOTOMY IV (CUSTOM PROCEDURE TRAY) IMPLANT
PAD ARMBOARD 7.5X6 YLW CONV (MISCELLANEOUS) ×1 IMPLANT
PENCIL SMOKE EVACUATOR (MISCELLANEOUS) ×1 IMPLANT
SPONGE T-LAP 18X18 ~~LOC~~+RFID (SPONGE) IMPLANT
SURGILUBE 2OZ TUBE FLIPTOP (MISCELLANEOUS) ×1 IMPLANT
SUT ETHILON 2 0 FS 18 (SUTURE) IMPLANT
SYR BULB EAR ULCER 3OZ GRN STR (SYRINGE) ×1 IMPLANT
TOWEL GREEN STERILE (TOWEL DISPOSABLE) ×1 IMPLANT
TOWEL GREEN STERILE FF (TOWEL DISPOSABLE) ×1 IMPLANT
TUBE CONNECTING 12X1/4 (SUCTIONS) ×1 IMPLANT
YANKAUER SUCT BULB TIP NO VENT (SUCTIONS) ×1 IMPLANT

## 2022-12-25 NOTE — Transfer of Care (Signed)
Immediate Anesthesia Transfer of Care Note  Patient: Oscar Acosta  Procedure(s) Performed: IRRIGATION AND DEBRIDEMENT PERIRECTAL ABSCESS  Patient Location: PACU  Anesthesia Type:General  Level of Consciousness: drowsy  Airway & Oxygen Therapy: Patient Spontanous Breathing and Patient connected to nasal cannula oxygen  Post-op Assessment: Report given to RN and Post -op Vital signs reviewed and stable  Post vital signs: Reviewed and stable  Last Vitals:  Vitals Value Taken Time  BP 106/63 12/25/22 1325  Temp    Pulse 82 12/25/22 1329  Resp 20 12/25/22 1329  SpO2 95 % 12/25/22 1329  Vitals shown include unvalidated device data.  Last Pain:  Vitals:   12/25/22 1325  TempSrc:   PainSc: Asleep         Complications: No notable events documented.

## 2022-12-25 NOTE — Anesthesia Postprocedure Evaluation (Signed)
Anesthesia Post Note  Patient: Oscar Acosta  Procedure(s) Performed: IRRIGATION AND [place]     Patient location during evaluation: PACU Anesthesia Type: General Level of consciousness: awake and alert Pain management: pain level controlled Vital Signs Assessment: post-procedure vital signs reviewed and stable Respiratory status: spontaneous breathing, nonlabored ventilation and respiratory function stable Cardiovascular status: stable and blood pressure returned to baseline Anesthetic complications: no   No notable events documented.  Last Vitals:  Vitals:   12/25/22 1201 12/25/22 1325  BP: 125/79 106/63  Pulse: (!) 49 61  Resp: 16 16  Temp: 36.8 C 36.4 C  SpO2: 96% 93%    Last Pain:  Vitals:   12/25/22 1325  TempSrc:   PainSc: [place]

## 2022-12-25 NOTE — ED Triage Notes (Signed)
R buttock boil that started draining last night. Reports still draining. Has been there since Monday. He reports he thought it would feel better after drained but still in pain.

## 2022-12-25 NOTE — H&P (Signed)
[facility] Admission Note  Oscar Acosta [birth year]  [id number].    Requesting MD: Sherwood Gambler Chief Complaint/Reason for Consult: perianal abscess  HPI:  Oscar Acosta is a 42 y.o. male with no significant PMH who presented to the ED with 1-2 weeks of worsening perianal pain. States that the pain has gradually gotten worse. He noticed a small pimple in the area that grew in size. It did drain some yesterday. Due to worsening pain he decided to come to the ED. CT scan in the ED showed a tract extending from the perianal soft tissues on the left to a superficial upper medial buttock collection on the right measuring 2.4 cm consistent with a fistula and abscess.  Of note, he does have a prior history of this occurring once before in 2019. He was taken to the OR by Dr. Dalbert Batman for Exam under anesthesia, anoscopy, incision and drainage right perirectal abscess. He has had no issues since then.   Anticoagulants: none Smokes black and milds daily Denies alcohol or illicit drug use Employment: not currently working  History reviewed. No pertinent family history.  History reviewed. No pertinent past medical history.  Past Surgical History:  Procedure Laterality Date   ANOSCOPY  05/12/2018   Procedure: ANOSCOPY;  Surgeon: Fanny Skates, MD;  Location: [facility] ORS;  Service: General;;   APPENDECTOMY     INCISION AND DRAINAGE PERIRECTAL ABSCESS N/A 05/12/2018   Procedure: IRRIGATION AND DEBRIDEMENT PERIRECTAL ABSCESS;  Surgeon: Fanny Skates, MD;  Location: [facility] ORS;  Service: General;  Laterality: N/A;    Social History:  reports that he has been smoking cigarettes. He has been smoking an average of .5 packs per day. He has never used smokeless tobacco. He reports that he does not drink alcohol and does not use drugs.  Allergies: No Known Allergies  (Not in a hospital admission)   Prior to Admission medications   Medication Sig Start Date End Date Taking? Authorizing Provider   albuterol (VENTOLIN HFA) 108 (90 Base) MCG/ACT inhaler Inhale 2 puffs into the lungs every 4 (four) hours as needed for wheezing or shortness of breath. 01/29/22   Pollina, Gwenyth Allegra, MD  benzonatate (TESSALON) 200 MG capsule Take 1 capsule (200 mg total) by mouth 3 (three) times daily as needed for cough. 01/29/22   Orpah Greek, MD  cyclobenzaprine (FLEXERIL) 5 MG tablet Take 1 tablet (5 mg total) by mouth 3 (three) times daily as needed for muscle spasms. 09/15/21   Orvis Brill, MD  naproxen (NAPROSYN) 500 MG tablet Take 1 tablet (500 mg total) by mouth 2 (two) times daily as needed for moderate pain. 12/17/21   Petrucelli, Aldona Bar R, PA-C  predniSONE (DELTASONE) 20 MG tablet Take 2 tablets (40 mg total) by mouth daily with breakfast. 01/29/22   Pollina, Gwenyth Allegra, MD    Blood pressure 129/84, pulse (!) 49, temperature 98.8 F (37.1 C), resp. rate 16, SpO2 97 %. Physical Exam: General: pleasant, WD/WN male who is laying in bed in NAD HEENT: head is normocephalic, atraumatic.  Sclera are noninjected.  Pupils equal and round.  Ears and nose without any masses or lesions.  Mouth is pink and moist. Dentition fair Heart: regular, rate, and rhythm Lungs: Respiratory effort nonlabored on room air Abd: soft, ND, NT Skin: warm and dry  GU: small fluctuant area to the right of the anus that is tender and has a small amount of purulent drainage  Results for orders placed or performed during the hospital  encounter of 12/25/22 (from the past 48 hour(s))  I-stat chem 8, ED (not at [facility], [facility] or [facility])     Status: Abnormal   Collection Time: 12/25/22  8:33 AM  Result Value Ref Range   Sodium 141 135 - 145 mmol/L   Potassium 3.7 3.5 - 5.1 mmol/L   Chloride 104 98 - 111 mmol/L   BUN 19 6 - 20 mg/dL   Creatinine, Ser 1.00 0.61 - 1.24 mg/dL   Glucose, Bld 90 70 - 99 mg/dL    Comment: Glucose reference range applies only to samples taken after fasting for at least 8 hours.    Calcium, Ion 1.14 (L) 1.15 - 1.40 mmol/L   TCO2 25 22 - 32 mmol/L   Hemoglobin 14.6 13.0 - 17.0 g/dL   HCT 43.0 39.0 - 52.0 %   CT PELVIS W CONTRAST  Result Date: 12/25/2022 CLINICAL DATA:  Buttock pain. EXAM: CT PELVIS WITH CONTRAST TECHNIQUE: Multidetector CT imaging of the pelvis was performed using the standard protocol following the bolus administration of intravenous contrast. RADIATION DOSE REDUCTION: This exam was performed according to the departmental dose-optimization program which includes automated exposure control, adjustment of the mA and/or kV according to patient size and/or use of iterative reconstruction technique. CONTRAST:  69mL OMNIPAQUE IOHEXOL 350 MG/ML SOLN COMPARISON:  None Available. FINDINGS: Urinary Tract:  Unremarkable urinary bladder. Bowel:  Imaged bowel in the pelvis unremarkable. Vascular/Lymphatic: No pathologically enlarged lymph nodes. No significant vascular abnormality seen. Reproductive:  Unremarkable prostate. Other: There is soft tissue attenuation extending from the perianal region on the left across the midline to the upper medial right buttock where there is a small superficial subcutaneous fluid collection measuring 2.4 cm. Musculoskeletal: No suspicious bone lesions identified. IMPRESSION: There is a tract extending from the perianal soft tissues on the left to a superficial upper medial buttock collection on the right measuring 2.4 cm consistent with a fistula and abscess. Electronically Signed   By: Sammie Bench M.D.   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 This has occurred once before in 2019 recurring EUA and I&D in the OR. Given recurrence and presence of a fistula recommend EUA and I&D in the OR today. He did have some coffee with creamer around 0630 this morning, otherwise NPO. Will plan for surgery today. IV zosyn ordered. He may be able to go home from PACU.   I  reviewed ED provider notes, last 24 h vitals and pain scores, last 48 h intake and output, last 24 h labs and trends, and last 24 h imaging results.   Wellington Hampshire, [place] [facility] 12/25/2022, 11:17 AM Please see Amion for pager number during day hours 7:00am-4:30pm

## 2022-12-25 NOTE — Op Note (Signed)
Pre-op diagnosis: Right perianal abscess Postop diagnosis: Right perianal abscess with tracking posteriorly  Description of procedure incision and drainage of right perianal abscess with placement of Penrose drain Surgeon:Abbigael Detlefsen K Tashina Credit Anesthesia: General Indications: This is a 42 year old male who is otherwise healthy who presents with a history of a right perianal abscess that was drained in 2019.  He presents with 2 weeks of worsening right perianal pain.  He presented to the emergency department for evaluation.  CT scan showed a right superficial medial buttock abscess with some tracking extending towards the posterior midline.  We recommended examination under anesthesia.  Description of procedure: Patient was brought to the operating room and placed in the supine position on the operating room table.  After an adequate level general anesthesia was obtained, his legs were placed in lithotomy position.  His perineum was prepped with Betadine and draped in sterile fashion.  A timeout was taken to ensure the proper patient and proper procedure.  I performed a digital rectal examination.  I cannot identify any fistula openings in the lower rectum.  There are no abscesses palpated with from inside the rectum.  No evidence of hemorrhoid disease.  The patient has an area of induration measuring approximately 1.5 cm in diameter in the right medial buttock approximately 3 cm away from the edge of the anus.  I made a round incision in this area.  We encountered some purulent fluid posteriorly in this wound.  The skin and subcutaneous tissue that were excised were sent for pathologic examination.  I then identified a small tunnel tracking posteriorly.  I gently probed this area with a hemostat.  This seems to tunnel for distance of about 4 cm posteriorly.  This tract does not cross the midline.  It does come out fairly superficial in the posterior midline.  I made a small counterincision in this area.  We  irrigated the wound and the tunnel thoroughly.  We inspected for hemostasis.  1/4 inch Penrose drain was then passed through the tunnel.  This was cut to the appropriate length and secured at both ends with 2-0 Ethilon.  The larger wound in the right gluteal region was packed with saline moistened gauze.  A dry dressing was applied with.  This was secured with tape and mesh shorts.  The patient was then extubated and brought to the recovery room in stable condition.  All sponge, instrument, and needle counts are correct.  Imogene Burn. Georgette Dover, MD, [facility]  General Surgery   12/25/2022 1:16 PM

## 2022-12-25 NOTE — ED Provider Notes (Signed)
Friend Provider Note   CSN: [id number] Arrival date & time: 12/25/22  [id number]     History  Chief Complaint  Patient presents with   Abscess    Oscar Acosta is a 42 y.o. male.  HPI 42 year old male with a prior history of a perirectal/perianal abscess presents with right buttock abscess.  He states this has been present for about a week.  Is been painful to sit.  Started draining blood yesterday and the pain seems to be worse.  No systemic symptoms such as fever and chills.  No rectal pain.  Home Medications Prior to Admission medications   Medication Sig Start Date End Date Taking? Authorizing Provider  oxyCODONE (OXY IR/ROXICODONE) 5 MG immediate release tablet Take 1 tablet (5 mg total) by mouth every 6 (six) hours as needed for severe pain. 12/25/22  Yes Simaan, Darci Current, PA-C  albuterol (VENTOLIN HFA) 108 (90 Base) MCG/ACT inhaler Inhale 2 puffs into the lungs every 4 (four) hours as needed for wheezing or shortness of breath. 01/29/22   Pollina, Gwenyth Allegra, MD  benzonatate (TESSALON) 200 MG capsule Take 1 capsule (200 mg total) by mouth 3 (three) times daily as needed for cough. 01/29/22   Orpah Greek, MD  cyclobenzaprine (FLEXERIL) 5 MG tablet Take 1 tablet (5 mg total) by mouth 3 (three) times daily as needed for muscle spasms. 09/15/21   Orvis Brill, MD  naproxen (NAPROSYN) 500 MG tablet Take 1 tablet (500 mg total) by mouth 2 (two) times daily as needed for moderate pain. 12/17/21   Petrucelli, Glynda Jaeger, PA-C  predniSONE (DELTASONE) 20 MG tablet Take 2 tablets (40 mg total) by mouth daily with breakfast. 01/29/22   Pollina, Gwenyth Allegra, MD      Allergies    Patient has no known allergies.    Review of Systems   Review of Systems  Constitutional:  Negative for chills and fever.    Physical Exam Updated Vital Signs BP 106/63 (BP Location: Right Arm)   Pulse 61   Temp 97.6 F (36.4 C)   Resp 16    Ht 6\' 1"  (1.854 m)   Wt 86.2 kg   SpO2 93%   BMI 25.07 kg/m  Physical Exam Vitals and nursing note reviewed. Exam conducted with a chaperone present.  Constitutional:      Appearance: He is well-developed.  HENT:     Head: Normocephalic and atraumatic.  Pulmonary:     Effort: Pulmonary effort is normal.  Genitourinary:   Skin:    General: Skin is warm and dry.  Neurological:     Mental Status: He is alert.     ED Results / Procedures / Treatments   Labs (all labs ordered are listed, but only abnormal results are displayed) Labs Reviewed  I-STAT CHEM 8, ED - Abnormal; Notable for the following components:      Result Value   Calcium, Ion 1.14 (*)    All other components within normal limits  NO BLOOD PRODUCTS  SURGICAL PATHOLOGY    EKG None  Radiology CT PELVIS W CONTRAST  Result Date: 12/25/2022 CLINICAL DATA:  Buttock pain. EXAM: CT PELVIS WITH CONTRAST TECHNIQUE: Multidetector CT imaging of the pelvis was performed using the standard protocol following the bolus administration of intravenous contrast. RADIATION DOSE REDUCTION: This exam was performed according to the departmental dose-optimization program which includes automated exposure control, adjustment of the mA and/or kV according to patient size and/or use  of iterative reconstruction technique. CONTRAST:  60mL OMNIPAQUE IOHEXOL 350 MG/ML SOLN COMPARISON:  None Available. FINDINGS: Urinary Tract:  Unremarkable urinary bladder. Bowel:  Imaged bowel in the pelvis unremarkable. Vascular/Lymphatic: No pathologically enlarged lymph nodes. No significant vascular abnormality seen. Reproductive:  Unremarkable prostate. Other: There is soft tissue attenuation extending from the perianal region on the left across the midline to the upper medial right buttock where there is a small superficial subcutaneous fluid collection measuring 2.4 cm. Musculoskeletal: No suspicious bone lesions identified. IMPRESSION: There is a tract  extending from the perianal soft tissues on the left to a superficial upper medial buttock collection on the right measuring 2.4 cm consistent with a fistula and abscess. Electronically Signed   By: Sammie Bench M.D.   On: 12/25/2022 09:50    Procedures Procedures    Medications Ordered in ED Medications  0.45 % sodium chloride infusion (has no administration in time range)  ondansetron (ZOFRAN) injection 4 mg ( Intravenous MAR Hold 12/25/22 1158)  HYDROmorphone (DILAUDID) injection 0.5 mg ( Intravenous MAR Hold 12/25/22 1158)  oxyCODONE (Oxy IR/ROXICODONE) immediate release tablet 5 mg (has no administration in time range)    Or  oxyCODONE (ROXICODONE) 5 MG/5ML solution 5 mg (has no administration in time range)  fentaNYL (SUBLIMAZE) injection 25-50 mcg (has no administration in time range)  promethazine (PHENERGAN) injection 6.25-12.5 mg (has no administration in time range)  lactated ringers infusion ( Intravenous Anesthesia Volume Adjustment 12/25/22 1325)  lidocaine-EPINEPHrine (XYLOCAINE W/EPI) 2 %-1:200000 (PF) injection 20 mL (20 mLs Intradermal Given 12/25/22 0840)  HYDROmorphone (DILAUDID) injection 1 mg (1 mg Intravenous Given 12/25/22 0832)  sodium chloride 0.9 % bolus 1,000 mL (1,000 mLs Intravenous New Bag/Given 12/25/22 0837)  ondansetron (ZOFRAN) injection 4 mg (4 mg Intravenous Given 12/25/22 0831)  iohexol (OMNIPAQUE) 350 MG/ML injection 75 mL (75 mLs Intravenous Contrast Given 12/25/22 0922)  ketorolac (TORADOL) 15 MG/ML injection 15 mg (15 mg Intravenous Given 12/25/22 1146)  piperacillin-tazobactam (ZOSYN) IVPB 3.375 g (3.375 g Intravenous Given 12/25/22 1236)  chlorhexidine (PERIDEX) 0.12 % solution 15 mL (15 mLs Mouth/Throat Given 12/25/22 1221)    Or  Oral care mouth rinse ( Mouth Rinse See Alternative 12/25/22 1221)    ED Course/ Medical Decision Making/ A&P                             Medical Decision Making Amount and/or Complexity of Data Reviewed Labs:      Details: Normal renal function Radiology: ordered and independent interpretation performed.    Details: Perianal abscess with fistula  Risk Prescription drug management.   Discussed case with general surgery.  After review of the CT, with the fistula they will take to the OR for an exam under anesthesia and possible seton.        Final Clinical Impression(s) / ED Diagnoses Final diagnoses:  Perianal abscess    Rx / DC Orders ED Discharge Orders          Ordered    Increase activity slowly        12/25/22 1331    Call MD for:  temperature >100.4        12/25/22 1331    Call MD for:  severe uncontrolled pain        12/25/22 1331    oxyCODONE (OXY IR/ROXICODONE) 5 MG immediate release tablet  Every 6 hours PRN        12/25/22 1331  Sherwood Gambler, MD 12/25/22 1415

## 2022-12-25 NOTE — ED Notes (Signed)
Patient left the floor in stable condition, AOX4, with his belongings and his family.

## 2022-12-25 NOTE — Anesthesia Procedure Notes (Signed)
Procedure Name: Intubation Date/Time: 12/25/2022 12:43 PM  Performed by: Leonor Liv, CRNAPre-anesthesia Checklist: Patient identified, Emergency Drugs available, Suction available and Patient being monitored Patient Re-evaluated:Patient Re-evaluated prior to induction Oxygen Delivery Method: Circle System Utilized Preoxygenation: Pre-oxygenation with 100% oxygen Induction Type: Rapid sequence, Cricoid Pressure applied and IV induction Laryngoscope Size: Mac and 4 Grade View: Grade II Tube type: Oral Number of attempts: 2 (DLx1 grade III, DLx1 Grade II after repositioning head) Airway Equipment and Method: Stylet Placement Confirmation: ETT inserted through vocal cords under direct vision, positive ETCO2 and breath sounds checked- equal and bilateral Secured at: 22 cm Tube secured with: Tape Dental Injury: Teeth and Oropharynx as per pre-operative assessment

## 2022-12-25 NOTE — Anesthesia Preprocedure Evaluation (Addendum)
Anesthesia Evaluation  Patient identified by MRN, date of birth, ID band Patient awake    Reviewed: Allergy & Precautions, NPO status , Patient's Chart, lab work & pertinent test results  History of Anesthesia Complications Negative for: history of anesthetic complications  Airway Mallampati: II  TM Distance: >3 FB Neck ROM: Full    Dental  (+) Dental Advisory Given, Chipped   Pulmonary Current SmokerPatient did not abstain from smoking.   Pulmonary exam normal        Cardiovascular negative cardio ROS Normal cardiovascular exam     Neuro/Psych negative neurological ROS  negative psych ROS   GI/Hepatic negative GI ROS, Neg liver ROS,,,  Endo/Other  negative endocrine ROS    Renal/GU negative Renal ROS     Musculoskeletal negative musculoskeletal ROS (+)    Abdominal   Peds  Hematology  (+) REFUSES BLOOD PRODUCTS  Anesthesia Other Findings   Reproductive/Obstetrics                             Anesthesia Physical Anesthesia Plan  ASA: 2 and emergent  Anesthesia Plan: General   Post-op Pain Management: Tylenol PO (pre-op)*   Induction: Intravenous  PONV Risk Score and Plan: 1 and Treatment may vary due to age or medical condition, Ondansetron, Dexamethasone and Midazolam  Airway Management Planned: Oral ETT  Additional Equipment: None  Intra-op Plan:   Post-operative Plan: Extubation in OR  Informed Consent: I have reviewed the patients History and Physical, chart, labs and discussed the procedure including the risks, benefits and alternatives for the proposed anesthesia with the patient or authorized representative who has indicated his/her understanding and acceptance.     Dental advisory given  Plan Discussed with: CRNA and Anesthesiologist  Anesthesia Plan Comments:         Anesthesia Quick Evaluation

## 2022-12-26 ENCOUNTER — Encounter (HOSPITAL_COMMUNITY): Payer: Self-pay | Admitting: Surgery

## 2022-12-28 LAB — SURGICAL PATHOLOGY

## 2023-01-15 NOTE — Progress Notes (Signed)
   PROVIDER:  PUJA GOSAI MACZIS, PA  MRN: [id number] DOB: 17-May-1981 DATE OF ENCOUNTER: 01/15/2023 Interval History:   Oscar Acosta is a 42 y.o. male who underwent incision and drainage of right perianal abscess with placement of Penrose drain on 12/25/2022 by Dr. Belinda.  Discharged postop day 0.  His drain was removed on 12/31/2022 as it was causing difficulty with sitting and walking and there were no signs of infection at that time.    States he has not had much pain since drain removal.  He states drainage is minimal.  Denies fever.  He had a right perianal abscess that was drained in 2019 -at that time there were no external exam findings but CT scan showed a 3 cm abscess on the right side.  Dr. Gail was able to palpate the abscess in the posterior midline, just to the right.    Review of Systems:   ROS All other systems reviewed and are negative.  Medications:   Current Outpatient Medications on File Prior to Visit  Medication Sig Dispense Refill  . albuterol  MDI, PROVENTIL , VENTOLIN , PROAIR , HFA 90 mcg/actuation inhaler Inhale into the lungs     No current facility-administered medications on file prior to visit.    Physical Examination:   BP 110/70   Pulse 106   Temp 36.1 C (97 F)   Ht 185.4 cm (6' 1)   Wt 87.1 kg (192 lb)   SpO2 98%   BMI 25.33 kg/m  General: Well-developed, well-nourished, in no acute distress.    Rectal: There is a dime sized superficial opening on the right anterior perianal region.  No surrounding erythema, fluctuance, or drainage   Assessment and Plan:   Diagnoses and all orders for this visit:  Perianal abscess  Status post incision and drainage     Oscar Acosta is status post incision and drainage of right perianal abscess with placement of Penrose drain on 12/25/2022 by Dr. Belinda.  On exam today the wound is superficial and is healing well.  There are no signs of active infection.  He will continue to monitor the area and call if  he develops any recurrent issues.  Given that we are not sure if his previous abscess was in the same location, he is okay with continuing to monitor the area before following up with a colorectal surgeon.  Return if symptoms worsen or fail to improve.  Puja Maczis, [facility]

## 2023-04-25 IMAGING — CT CT RENAL STONE PROTOCOL
2 of 4 series · 17 of 46 positions shown, 19 images · non-contrast
Comparison: CT pelvis dated 05/12/2018

CLINICAL DATA: Back pain, nephrolithiasis

EXAM:
CT ABDOMEN AND PELVIS WITHOUT CONTRAST
TECHNIQUE: Multidetector CT imaging of the abdomen and pelvis was performed
following the standard protocol without IV contrast.

[Series 3: renal stone 5.0 · axial · 0.70mm/px · z∈[+670,+1085]mm · 14 of 91 slices shown, 16 images]
[im 4/91  soft-tissue]
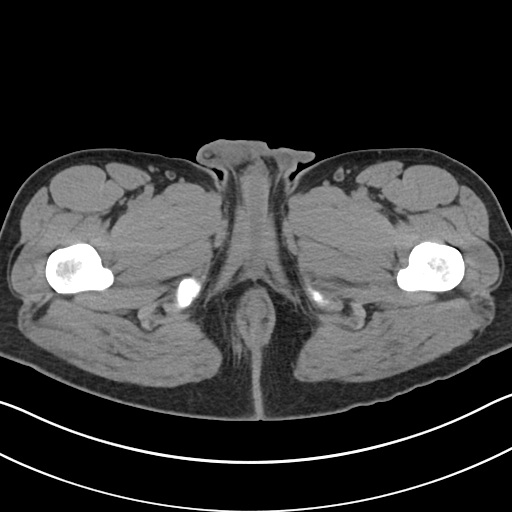
[im 4/91  bone]
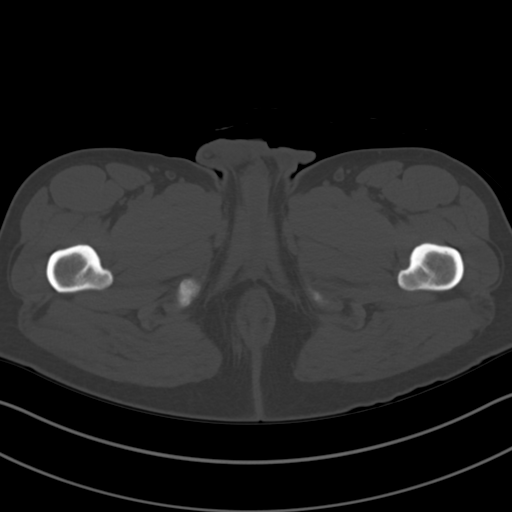
[im 11/91  soft-tissue]
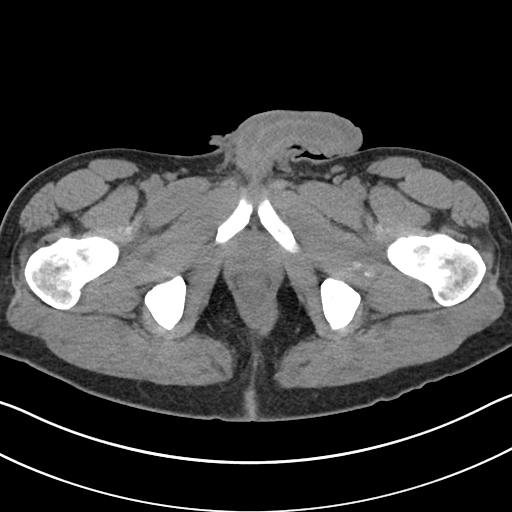
[im 18/91  soft-tissue]
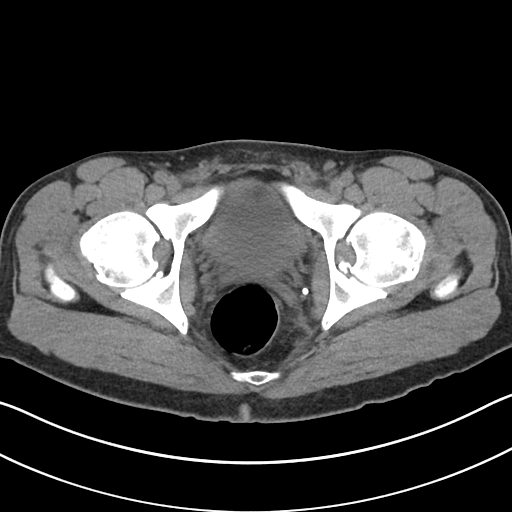
[im 25/91  soft-tissue]
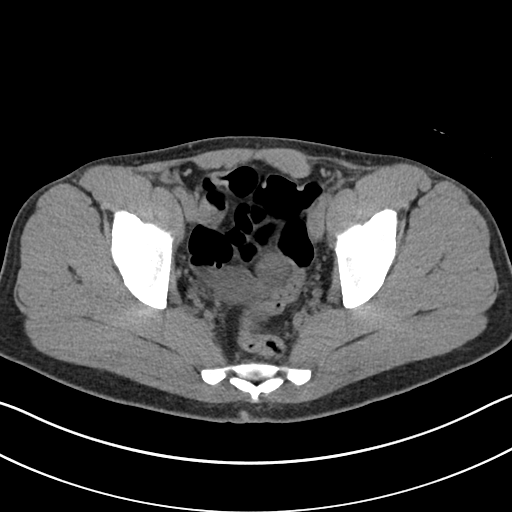
[im 32/91  soft-tissue]
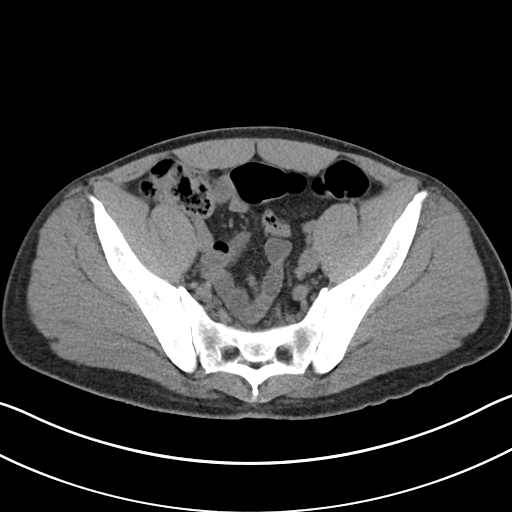
[im 35/91  soft-tissue]
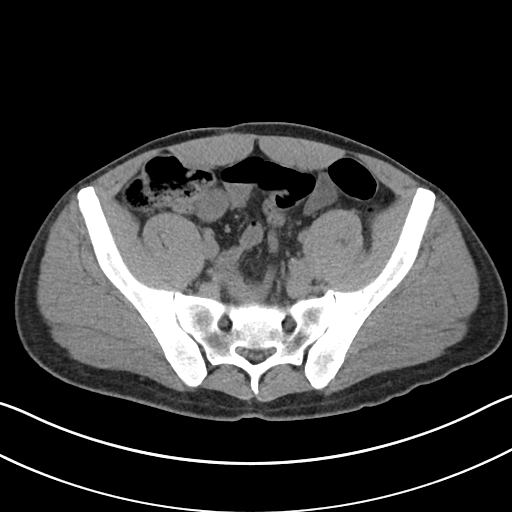
[im 42/91  soft-tissue]
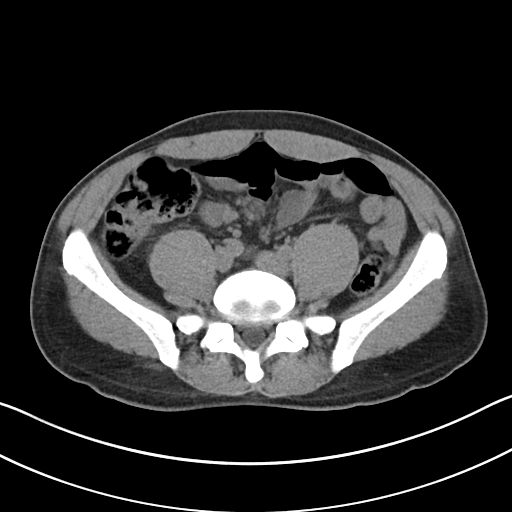
[im 49/91  soft-tissue]
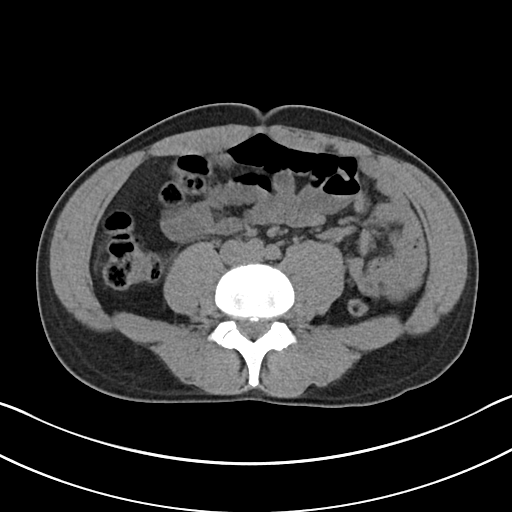
[im 56/91  soft-tissue]
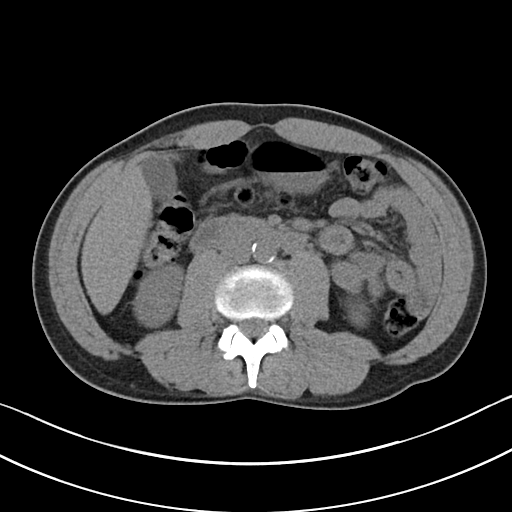
[im 56/91  bone]
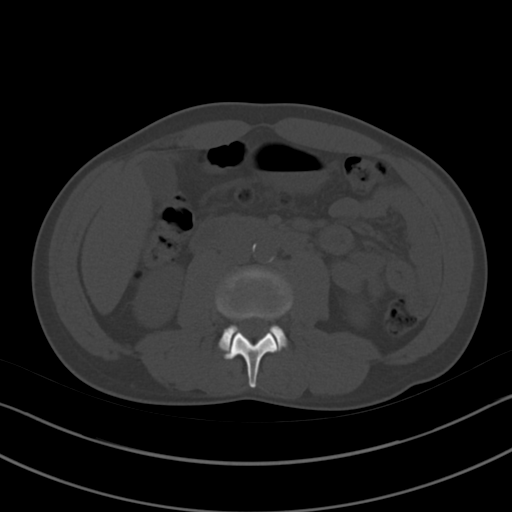
[im 59/91  soft-tissue]
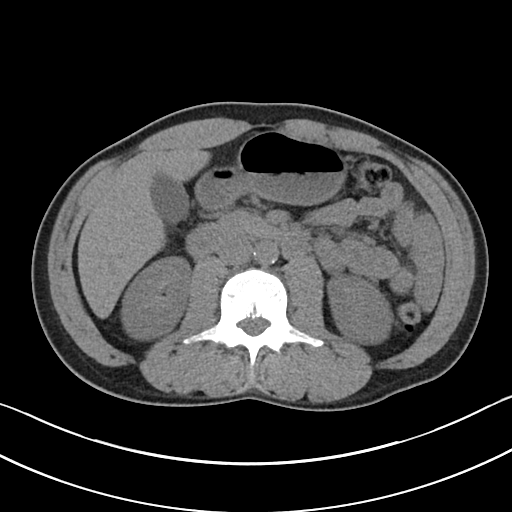
[im 66/91  soft-tissue]
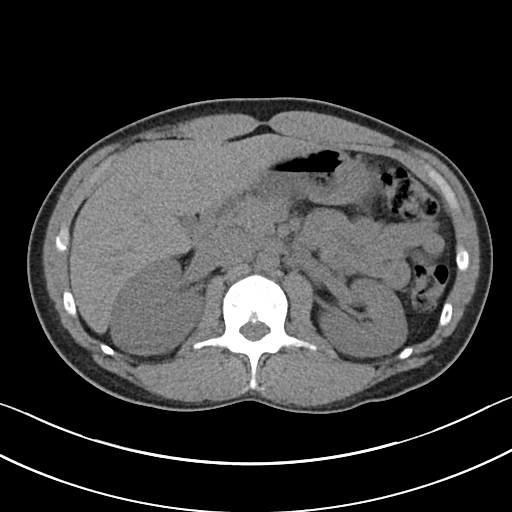
[im 73/91  soft-tissue]
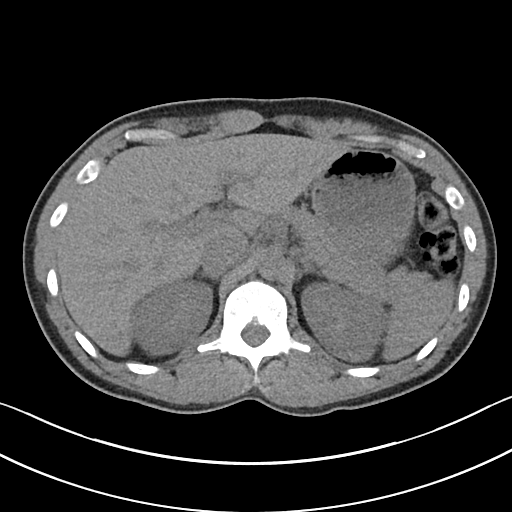
[im 80/91  soft-tissue]
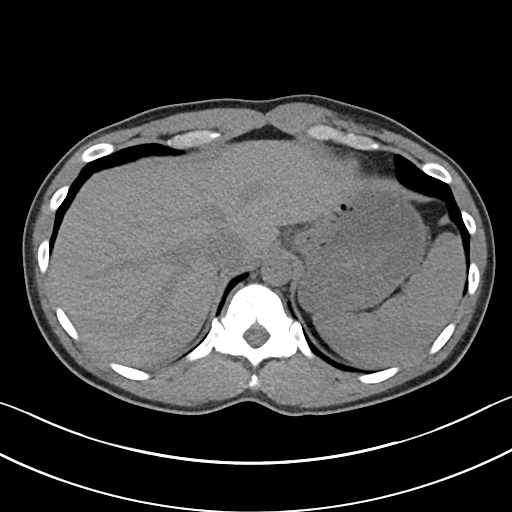
[im 87/91  soft-tissue]
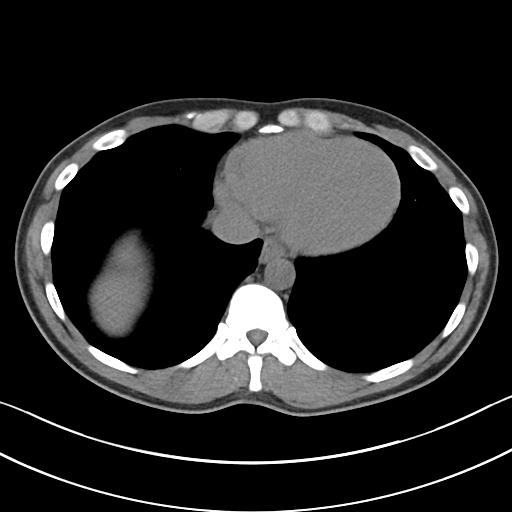

[Series 6: cor · coronal · 0.86mm/px · 3 of 116 slices shown]
[im 39/116  soft-tissue]
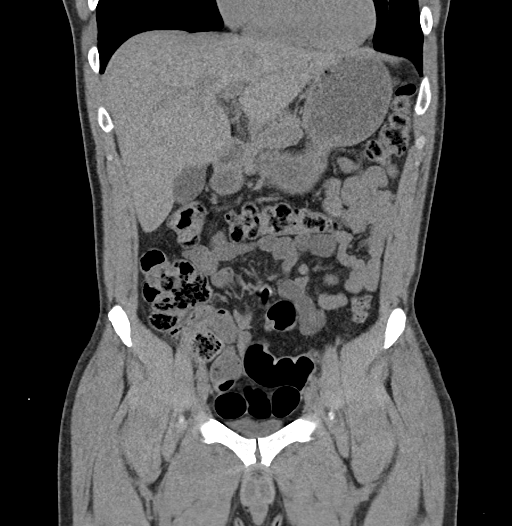
[im 52/116  soft-tissue]
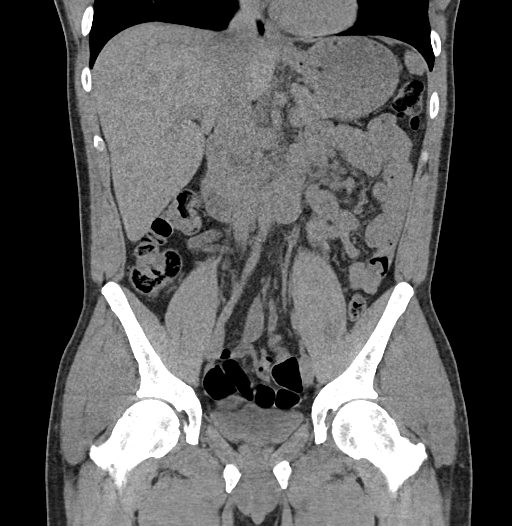
[im 64/116  soft-tissue]
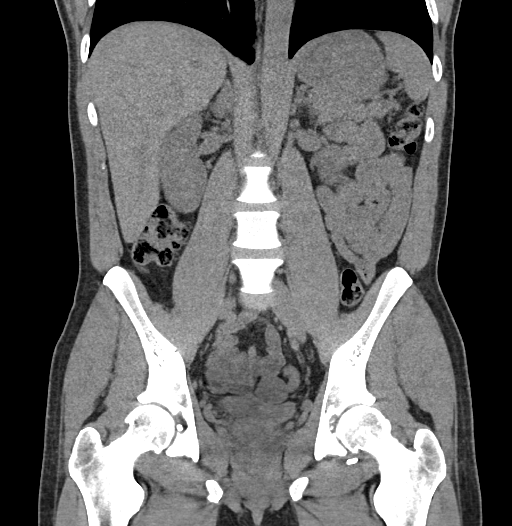

[17 of 46 positions shown; findings below may reference images not displayed]

FINDINGS: Lower chest: Mild subpleural scarring at the lung bases.

Hepatobiliary: Unenhanced liver is unremarkable.

Gallbladder is unremarkable. No intrahepatic or extrahepatic ductal
dilatation.

Pancreas: Within normal limits.

Spleen: Within normal limits.

Adrenals/Urinary Tract: Adrenal glands are within normal limits.

17 mm right upper pole renal cyst (series 3/image 23). Kidneys are
otherwise within normal limits. No renal, ureteral, or bladder
calculi. No hydronephrosis.

Bladder is underdistended but unremarkable.

Stomach/Bowel: Stomach is within normal limits.

No evidence of bowel obstruction.

Appendix is not discretely visualized, reportedly surgically absent.

Linear perianal soft tissue extending from the 5 o'clock position
(series 3/image 81) to the right ischiorectal fossa (series 3/images
84 and 91), sequela of prior perianal abscess, without residual
fluid collection.

Vascular/Lymphatic: No evidence of abdominal aortic aneurysm.

Atherosclerotic calcifications of the abdominal aorta and branch
vessels.

No suspicious abdominopelvic lymphadenopathy.

Reproductive: Prostate is unremarkable.

Other: No abdominopelvic ascites.

Musculoskeletal: No focal osseous lesions.
IMPRESSION: No renal, ureteral, or bladder calculi.  No hydronephrosis.

Sequela of prior perianal abscess, without residual fluid
collection.

## 2023-04-25 IMAGING — DX DG CHEST 2V
2 series · 2 of 2 positions shown · non-contrast
Comparison: 06/19/2019

CLINICAL DATA: Posterior chest wall pain, tightness radiating to
right flank for 1 month

EXAM:
CHEST - 2 VIEW

[chest pa]
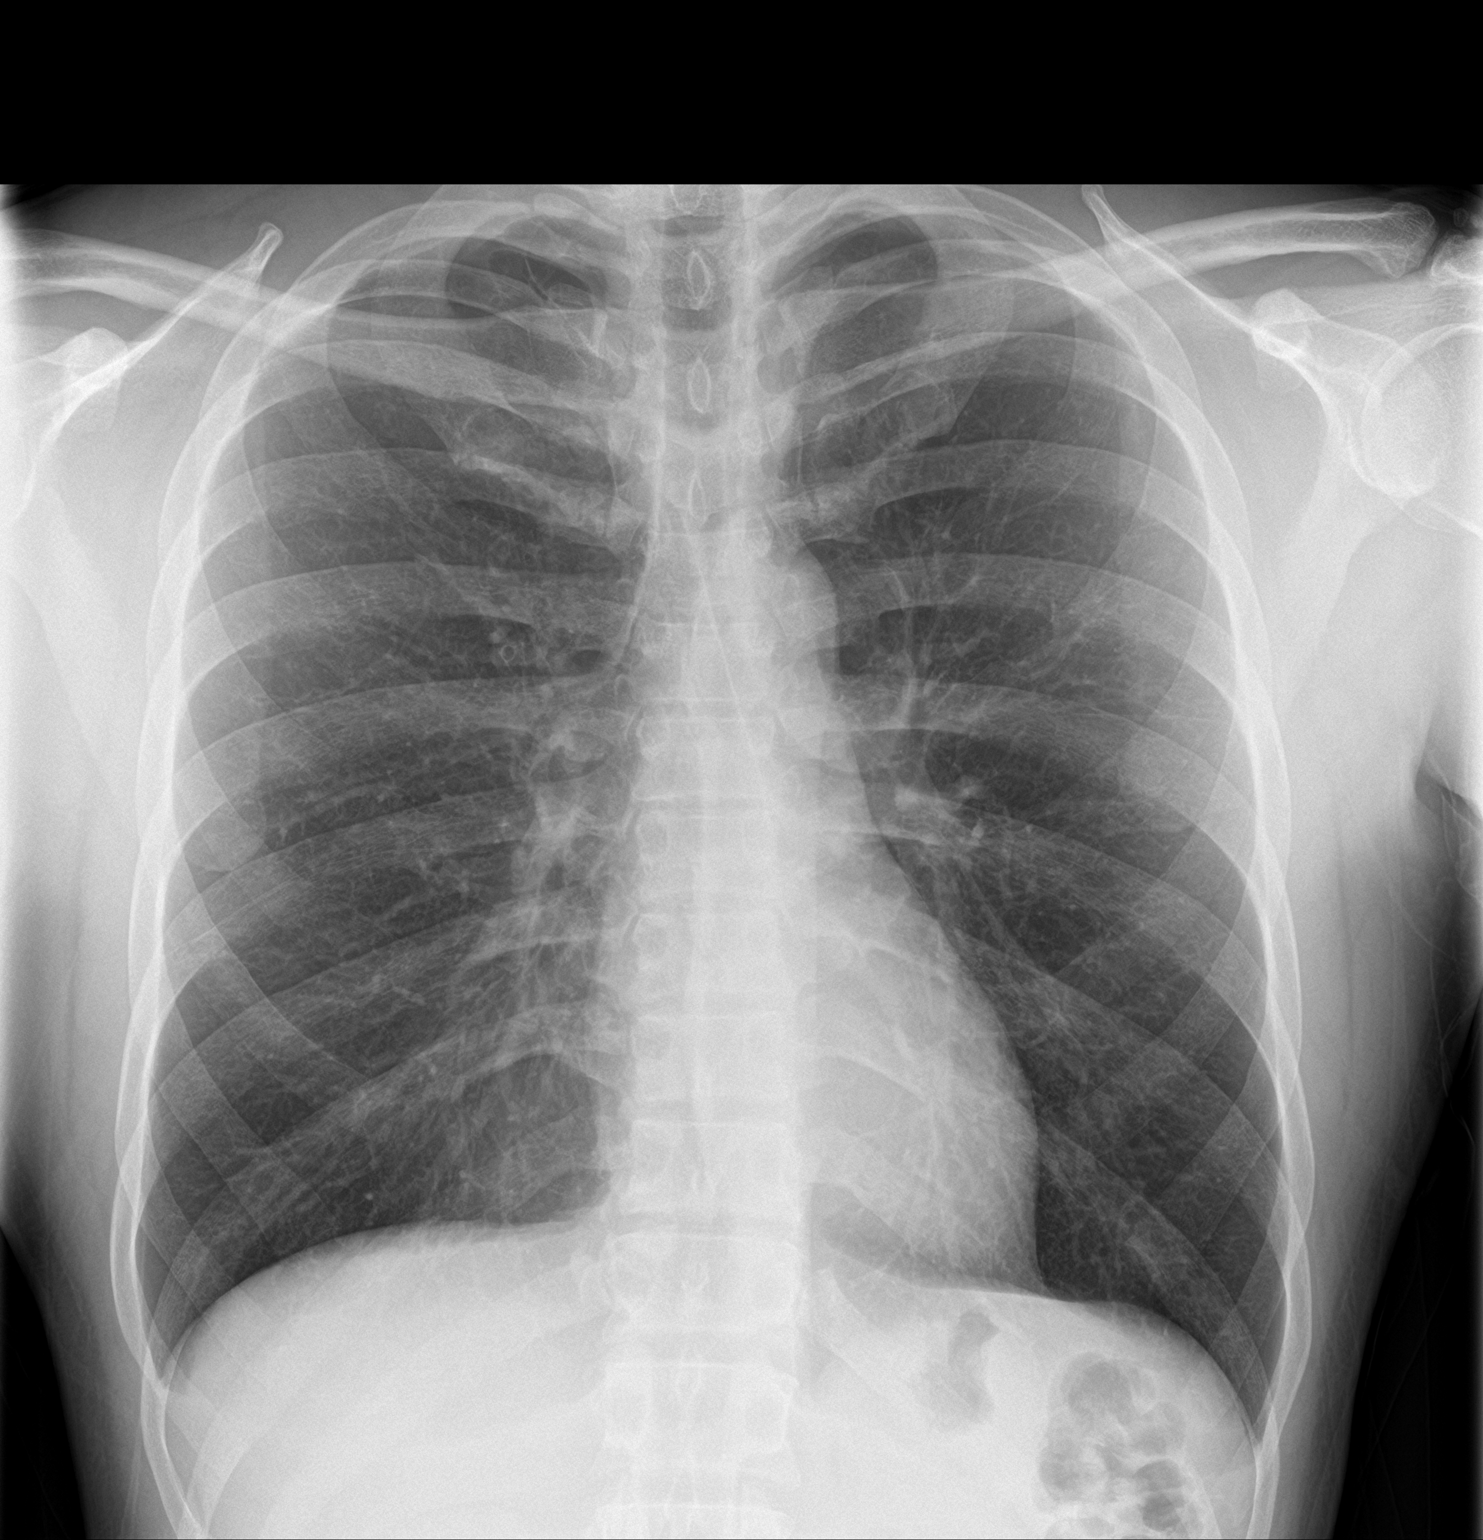

[chest lat]
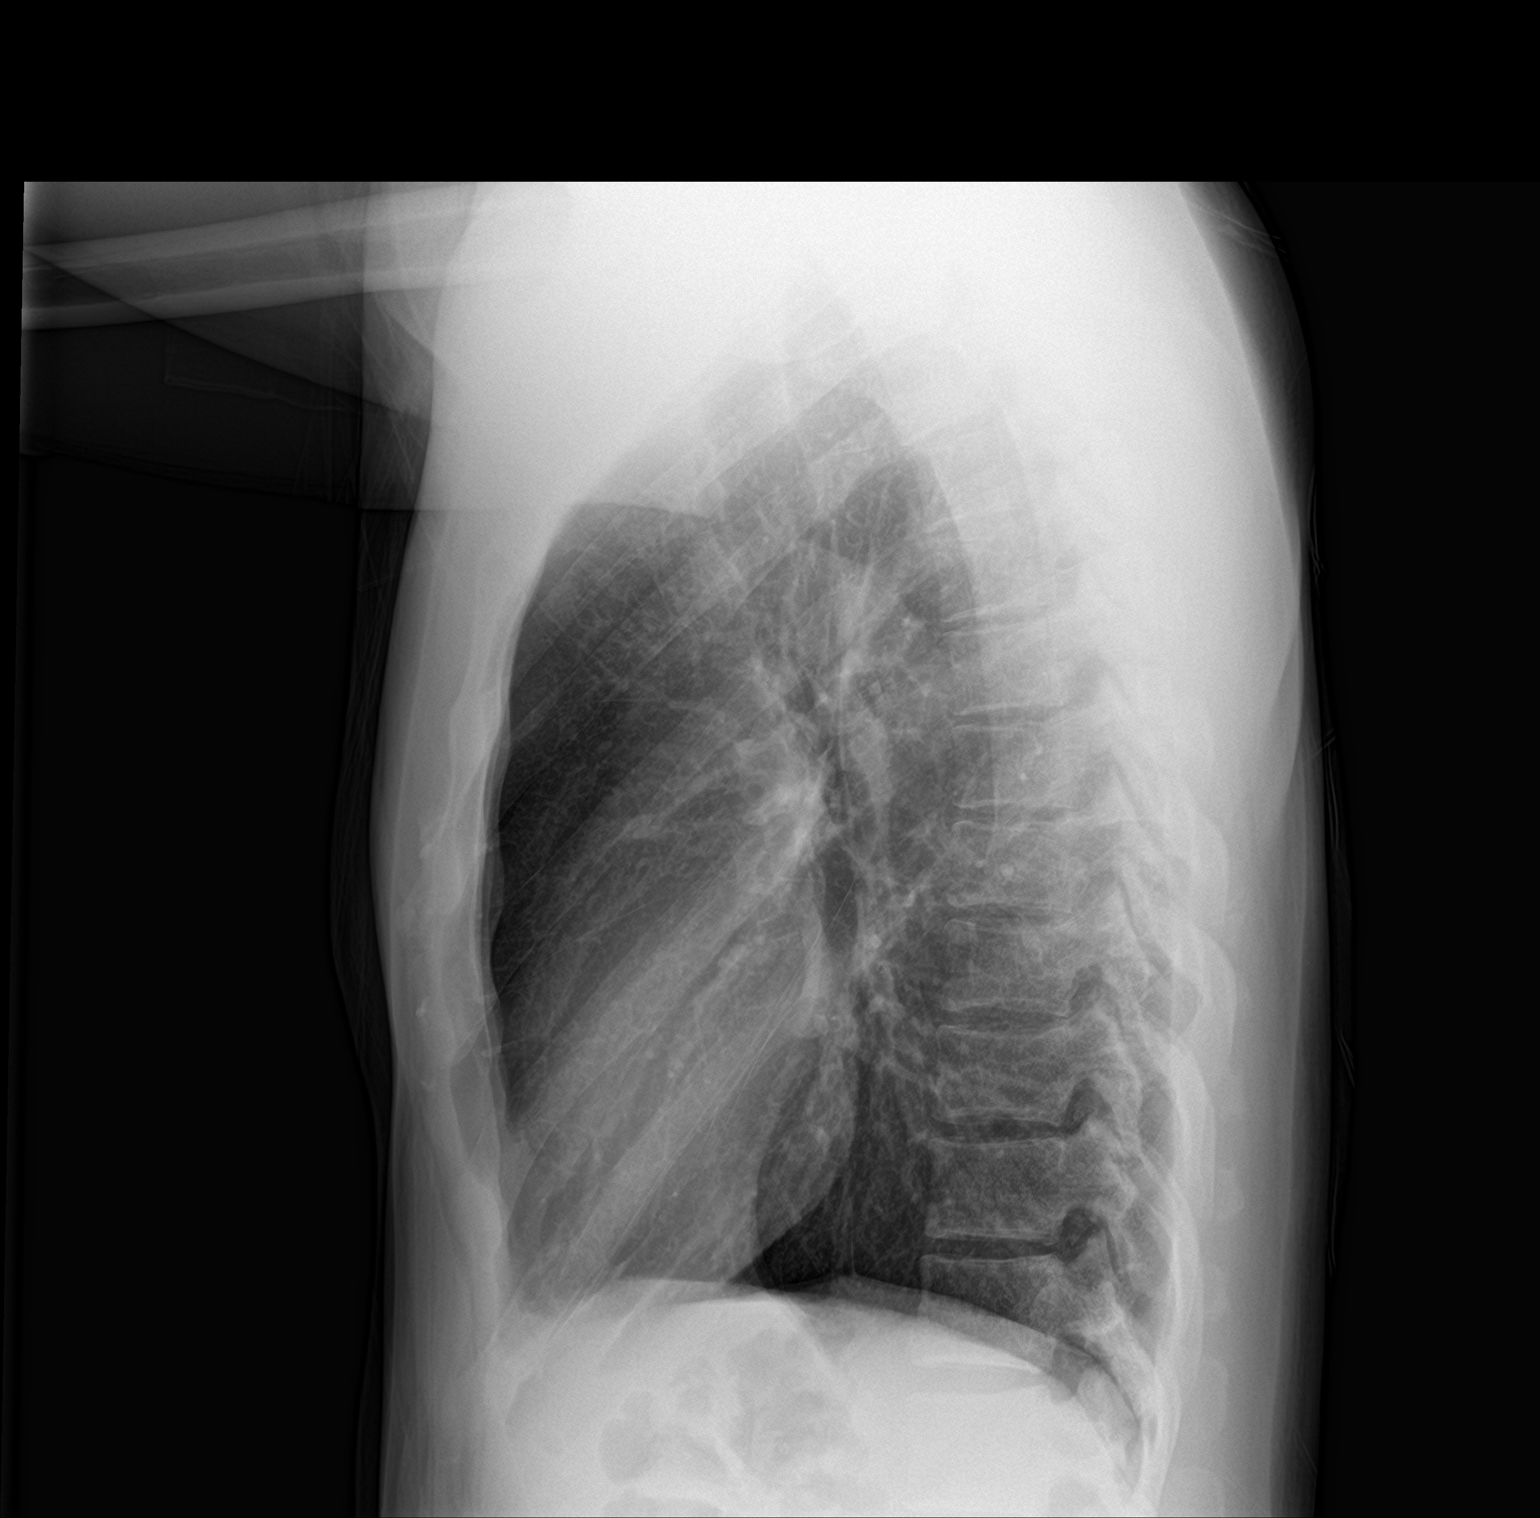

[2 of 2 positions shown; findings below may reference images not displayed]

FINDINGS: Frontal and lateral views of the chest demonstrate an unremarkable
cardiac silhouette. No acute airspace disease, effusion, or
pneumothorax. No acute bony abnormalities.
IMPRESSION: 1. No acute intrathoracic process.

## 2024-05-15 ENCOUNTER — Emergency Department (HOSPITAL_COMMUNITY)
Admission: EM | Admit: 2024-05-15 | Discharge: 2024-05-15 | Disposition: A | Discharge status: Home / Self Care | Payer: Self-pay | Attending: Emergency Medicine | Admitting: Emergency Medicine

## 2024-05-15 ENCOUNTER — Other Ambulatory Visit: Payer: Self-pay

## 2024-05-15 ENCOUNTER — Emergency Department (HOSPITAL_COMMUNITY): Payer: Self-pay

## 2024-05-15 ENCOUNTER — Encounter (HOSPITAL_COMMUNITY): Payer: Self-pay | Admitting: *Deleted

## 2024-05-15 DIAGNOSIS — R1030 Lower abdominal pain, unspecified: Secondary | ICD-10-CM

## 2024-05-15 DIAGNOSIS — K625 Hemorrhage of anus and rectum: Secondary | ICD-10-CM | POA: Insufficient documentation

## 2024-05-15 LAB — COMPREHENSIVE METABOLIC PANEL WITH GFR
ALT: 27 U/L (ref 0–44)
AST: 26 U/L (ref 15–41)
Albumin: 3.6 g/dL (ref 3.5–5.0)
Alkaline Phosphatase: 80 U/L (ref 38–126)
Anion gap: 13 (ref 5–15)
BUN: 15 mg/dL (ref 6–20)
CO2: 20 mmol/L — ABNORMAL LOW (ref 22–32)
Calcium: 8.6 mg/dL — ABNORMAL LOW (ref 8.9–10.3)
Chloride: 106 mmol/L (ref 98–111)
Creatinine, Ser: 1.08 mg/dL (ref 0.61–1.24)
GFR, Estimated: >60 mL/min (ref >60)
Glucose, Bld: 112 mg/dL — ABNORMAL HIGH (ref 70–99)
Potassium: 3.6 mmol/L (ref 3.5–5.1)
Sodium: 139 mmol/L (ref 135–145)
Total Bilirubin: 0.6 mg/dL (ref 0.0–1.2)
Total Protein: 6.9 g/dL (ref 6.5–8.1)

## 2024-05-15 LAB — CBC
HCT: 40.3 % (ref 39.0–52.0)
Hemoglobin: 14.2 g/dL (ref 13.0–17.0)
MCH: 29 pg (ref 26.0–34.0)
MCHC: 35.2 g/dL (ref 30.0–36.0)
MCV: 82.4 fL (ref 80.0–100.0)
Platelets: 208 K/uL (ref 150–400)
RBC: 4.89 MIL/uL (ref 4.22–5.81)
RDW: 13.6 % (ref 11.5–15.5)
WBC: 8.1 K/uL (ref 4.0–10.5)
nRBC: 0 % (ref 0.0–0.2)

## 2024-05-15 LAB — PROTIME-INR
INR: 0.9 (ref 0.8–1.2)
Prothrombin Time: 12.3 s (ref 11.4–15.2)

## 2024-05-15 LAB — SAMPLE TO BLOOD BANK

## 2024-05-15 LAB — LIPASE, BLOOD: Lipase: 34 U/L (ref 11–51)

## 2024-05-15 MED ORDER — IOHEXOL 350 MG/ML SOLN
75.0000 mL | Freq: Once | INTRAVENOUS | Status: AC | PRN
  Administered 2024-05-15 (×2): 75 mL via INTRAVENOUS

## 2024-05-15 MED ORDER — DICYCLOMINE HCL 20 MG PO TABS: 20.0000 mg | ORAL_TABLET | Freq: Two times a day (BID) | ORAL | 0 refills | Status: AC

## 2024-05-15 MED ORDER — MORPHINE SULFATE (PF) 4 MG/ML IV SOLN
8.0000 mg | Freq: Once | INTRAVENOUS | Status: AC
  Administered 2024-05-15 (×2): 8 mg via INTRAVENOUS
  Filled 2024-05-15: qty 2

## 2024-05-15 MED ORDER — SODIUM CHLORIDE 0.9 % IV BOLUS
1000.0000 mL | Freq: Once | INTRAVENOUS | Status: AC
  Administered 2024-05-15 (×2): 1000 mL via INTRAVENOUS

## 2024-05-15 MED ORDER — ONDANSETRON HCL 4 MG/2ML IJ SOLN
4.0000 mg | Freq: Once | INTRAMUSCULAR | Status: AC
  Administered 2024-05-15 (×2): 4 mg via INTRAVENOUS
  Filled 2024-05-15: qty 2

## 2024-05-15 MED ORDER — AMOXICILLIN-POT CLAVULANATE 875-125 MG PO TABS: 1.0000 | ORAL_TABLET | Freq: Two times a day (BID) | ORAL | 0 refills | Status: AC

## 2024-05-15 MED ORDER — MORPHINE SULFATE (PF) 4 MG/ML IV SOLN
4.0000 mg | Freq: Once | INTRAVENOUS | Status: AC
  Administered 2024-05-15 (×2): 4 mg via INTRAVENOUS
  Filled 2024-05-15: qty 1

## 2024-05-15 NOTE — ED Triage Notes (Signed)
 The pt has had bright red blood in his stools for 4 days  he has had pain no diarrhea

## 2024-05-15 NOTE — ED Provider Notes (Signed)
 [place] EMERGENCY DEPARTMENT AT [facility] Provider Note   CSN: [id number] Arrival date & time: 05/15/24  9788     Patient presents with: Rectal Bleeding   Oscar Acosta is a 43 y.o. male.   43 yo M with a chief complaints of lower abdominal pain and bloody stools.  This been going on for about 4 days now.  He feels like the pain is gotten worse to his lower abdomen.  Crampy.  No fevers.  No vomiting.  Denies suspicious food intake denies recent travel.  No known sick contacts.   Rectal Bleeding      Prior to Admission medications   Medication Sig Start Date End Date Taking? Authorizing Provider  amoxicillin -clavulanate (AUGMENTIN ) 875-125 MG tablet Take 1 tablet by mouth every 12 (twelve) hours. 05/15/24  Yes Emil Share, DO  dicyclomine  (BENTYL ) 20 MG tablet Take 1 tablet (20 mg total) by mouth 2 (two) times daily. 05/15/24  Yes Emil Share, DO  albuterol  (VENTOLIN  HFA) 108 (90 Base) MCG/ACT inhaler Inhale 2 puffs into the lungs every 4 (four) hours as needed for wheezing or shortness of breath. 01/29/22   Pollina, Lonni PARAS, MD  benzonatate  (TESSALON ) 200 MG capsule Take 1 capsule (200 mg total) by mouth 3 (three) times daily as needed for cough. 01/29/22   Haze Lonni PARAS, MD  cyclobenzaprine  (FLEXERIL ) 5 MG tablet Take 1 tablet (5 mg total) by mouth 3 (three) times daily as needed for muscle spasms. 09/15/21   Terance Levada BRAVO, MD  naproxen  (NAPROSYN ) 500 MG tablet Take 1 tablet (500 mg total) by mouth 2 (two) times daily as needed for moderate pain. 12/17/21   Petrucelli, Samantha R, PA-C  oxyCODONE  (OXY IR/ROXICODONE ) 5 MG immediate release tablet Take 1 tablet (5 mg total) by mouth every 6 (six) hours as needed for severe pain. 12/25/22   Simaan, Elizabeth S, PA-C  predniSONE  (DELTASONE ) 20 MG tablet Take 2 tablets (40 mg total) by mouth daily with breakfast. 01/29/22   Pollina, Lonni PARAS, MD    Allergies: Patient has no known allergies.    Review of  Systems  Gastrointestinal:  Positive for hematochezia.    Updated Vital Signs BP 136/85 (BP Location: Right Arm)   Pulse 71   Temp 98.2 F (36.8 C)   Resp 16   Ht 6' 1 (1.854 m)   Wt 86.2 kg   SpO2 97%   BMI 25.07 kg/m   Physical Exam Vitals and nursing note reviewed.  Constitutional:      Appearance: He is well-developed.  HENT:     Head: Normocephalic and atraumatic.  Eyes:     Pupils: Pupils are equal, round, and reactive to light.  Neck:     Vascular: No JVD.  Cardiovascular:     Rate and Rhythm: Normal rate and regular rhythm.     Heart sounds: No murmur heard.    No friction rub. No gallop.  Pulmonary:     Effort: No respiratory distress.     Breath sounds: No wheezing.  Abdominal:     General: There is no distension.     Tenderness: There is abdominal tenderness. There is guarding. There is no rebound.     Comments: Diffuse lower abdominal discomfort with guarding.  Musculoskeletal:        General: Normal range of motion.     Cervical back: Normal range of motion and neck supple.  Skin:    Coloration: Skin is not pale.  Findings: No rash.  Neurological:     Mental Status: He is alert and oriented to person, place, and time.  Psychiatric:        Behavior: Behavior normal.     (all labs ordered are listed, but only abnormal results are displayed) Labs Reviewed  COMPREHENSIVE METABOLIC PANEL WITH GFR - Abnormal; Notable for the following components:      Result Value   CO2 20 (*)    Glucose, Bld 112 (*)    Calcium 8.6 (*)    All other components within normal limits  LIPASE, BLOOD  CBC  PROTIME-INR  URINALYSIS, ROUTINE W REFLEX MICROSCOPIC  SAMPLE TO BLOOD BANK    EKG: None  Radiology: CT ABDOMEN PELVIS W CONTRAST Result Date: 05/15/2024 CLINICAL DATA:  Acute nonlocalized abdominal pain, rectal bleeding EXAM: CT ABDOMEN AND PELVIS WITH CONTRAST TECHNIQUE: Multidetector CT imaging of the abdomen and pelvis was performed using the standard  protocol following bolus administration of intravenous contrast. RADIATION DOSE REDUCTION: This exam was performed according to the departmental dose-optimization program which includes automated exposure control, adjustment of the mA and/or kV according to patient size and/or use of iterative reconstruction technique. CONTRAST:  75mL OMNIPAQUE  IOHEXOL  350 MG/ML SOLN COMPARISON:  None Available. FINDINGS: Lower chest: No acute abnormality. Hepatobiliary: No focal liver abnormality is seen. No gallstones, gallbladder wall thickening, or biliary dilatation. Pancreas: Unremarkable Spleen: Unremarkable Adrenals/Urinary Tract: Adrenal glands are unremarkable. Simple cortical cyst is seen within the interpolar right kidney for which no follow-up imaging is recommended. The kidneys are otherwise unremarkable. Bladder unremarkable. Stomach/Bowel: Stomach is within normal limits. Appendix appears normal. No evidence of bowel wall thickening, distention, or inflammatory changes. Vascular/Lymphatic: Aortic atherosclerosis. No enlarged abdominal or pelvic lymph nodes. Reproductive: Prostate is unremarkable. Other: No abdominal wall hernia or abnormality. No abdominopelvic ascites. Musculoskeletal: No acute or significant osseous findings. IMPRESSION: 1. No acute intra-abdominal pathology identified. No definite radiographic explanation for the patient's reported symptoms. 2. Aortic atherosclerosis. Aortic Atherosclerosis (ICD10-I70.0). Electronically Signed   By: Dorethia Molt M.D.   On: 05/15/2024 03:51     Procedures   Medications Ordered in the ED  morphine  (PF) 4 MG/ML injection 4 mg (4 mg Intravenous Given 05/15/24 0320)  ondansetron  (ZOFRAN ) injection 4 mg (4 mg Intravenous Given 05/15/24 0320)  sodium chloride  0.9 % bolus 1,000 mL (1,000 mLs Intravenous New Bag/Given 05/15/24 0320)  iohexol  (OMNIPAQUE ) 350 MG/ML injection 75 mL (75 mLs Intravenous Contrast Given 05/15/24 0341)  morphine  (PF) 4 MG/ML injection 8  mg (8 mg Intravenous Given 05/15/24 0357)                                    Medical Decision Making Amount and/or Complexity of Data Reviewed Radiology: ordered.  Risk Prescription drug management.   43 yo M with a chief complaint of lower abdominal discomfort and bloody stools.  Going on for about 4 days now.  By history sounds like colitis, patient has more tenderness than I would expect on exam.  Will obtain CT imaging.  Treat pain and nausea.  Reassess.  On record review patient has had a history of recurrent rectal abscesses.  No obvious rectal pain endorsed by patient.  4:18 AM:  I have discussed the diagnosis/risks/treatment options with the patient.  Evaluation and diagnostic testing in the emergency department does not suggest an emergent condition requiring admission or immediate intervention beyond what has been performed  at this time.  They will follow up with PCP, GI. We also discussed returning to the ED immediately if new or worsening sx occur. We discussed the sx which are most concerning (e.g., sudden worsening pain, fever, inability to tolerate by mouth) that necessitate immediate return. Medications administered to the patient during their visit and any new prescriptions provided to the patient are listed below.  Medications given during this visit Medications  morphine  (PF) 4 MG/ML injection 4 mg (4 mg Intravenous Given 05/15/24 0320)  ondansetron  (ZOFRAN ) injection 4 mg (4 mg Intravenous Given 05/15/24 0320)  sodium chloride  0.9 % bolus 1,000 mL (1,000 mLs Intravenous New Bag/Given 05/15/24 0320)  iohexol  (OMNIPAQUE ) 350 MG/ML injection 75 mL (75 mLs Intravenous Contrast Given 05/15/24 0341)  morphine  (PF) 4 MG/ML injection 8 mg (8 mg Intravenous Given 05/15/24 0357)     The patient appears reasonably screen and/or stabilized for discharge and I doubt any other medical condition or other [facility] requiring further screening, evaluation, or treatment in the ED at this time  prior to discharge.       Final diagnoses:  Rectal bleeding  Lower abdominal pain    ED Discharge Orders          Ordered    amoxicillin -clavulanate (AUGMENTIN ) 875-125 MG tablet  Every 12 hours        05/15/24 0416    dicyclomine  (BENTYL ) 20 MG tablet  2 times daily        05/15/24 0416               Crystel Demarco, DO 05/15/24 [phone]

## 2024-05-15 NOTE — Discharge Instructions (Signed)
 Your test here do not show any obvious cause for your symptoms.  Your level of blood in your body is normal.  I am going to start you on antibiotics in case you have colitis.  Please follow-up with your family doctor and gastroenterology as they may want to perform a colonoscopy when your symptoms have improved.  Please return for sudden worsening pain fever or inability to eat or drink.

## 2024-07-20 ENCOUNTER — Emergency Department (HOSPITAL_COMMUNITY)
Admission: EM | Admit: 2024-07-20 | Discharge: 2024-07-20 | Disposition: A | Discharge status: Home / Self Care | Payer: Self-pay | Attending: Emergency Medicine | Admitting: Emergency Medicine

## 2024-07-20 ENCOUNTER — Emergency Department (HOSPITAL_COMMUNITY): Payer: Self-pay

## 2024-07-20 ENCOUNTER — Other Ambulatory Visit: Payer: Self-pay

## 2024-07-20 ENCOUNTER — Other Ambulatory Visit (HOSPITAL_BASED_OUTPATIENT_CLINIC_OR_DEPARTMENT_OTHER): Payer: Self-pay

## 2024-07-20 ENCOUNTER — Encounter (HOSPITAL_COMMUNITY): Payer: Self-pay

## 2024-07-20 DIAGNOSIS — J069 Acute upper respiratory infection, unspecified: Secondary | ICD-10-CM | POA: Insufficient documentation

## 2024-07-20 LAB — BASIC METABOLIC PANEL WITH GFR
Anion gap: 13 (ref 5–15)
BUN: 13 mg/dL (ref 6–20)
CO2: 22 mmol/L (ref 22–32)
Calcium: 9 mg/dL (ref 8.9–10.3)
Chloride: 102 mmol/L (ref 98–111)
Creatinine, Ser: 1.14 mg/dL (ref 0.61–1.24)
GFR, Estimated: >60 mL/min (ref >60)
Glucose, Bld: 100 mg/dL — ABNORMAL HIGH (ref 70–99)
Potassium: 3.2 mmol/L — ABNORMAL LOW (ref 3.5–5.1)
Sodium: 137 mmol/L (ref 135–145)

## 2024-07-20 LAB — RESP PANEL BY RT-PCR (RSV, FLU A&B, COVID)  RVPGX2
Influenza A by PCR: NEGATIVE
Influenza B by PCR: NEGATIVE
Resp Syncytial Virus by PCR: NEGATIVE
SARS Coronavirus 2 by RT PCR: NEGATIVE

## 2024-07-20 LAB — CBC
HCT: 45.7 % (ref 39.0–52.0)
Hemoglobin: 16.3 g/dL (ref 13.0–17.0)
MCH: 28.8 pg (ref 26.0–34.0)
MCHC: 35.7 g/dL (ref 30.0–36.0)
MCV: 80.9 fL (ref 80.0–100.0)
Platelets: 251 K/uL (ref 150–400)
RBC: 5.65 MIL/uL (ref 4.22–5.81)
RDW: 13.2 % (ref 11.5–15.5)
WBC: 7.3 K/uL (ref 4.0–10.5)
nRBC: 0 % (ref 0.0–0.2)

## 2024-07-20 LAB — TROPONIN I (HIGH SENSITIVITY): Troponin I (High Sensitivity): 4 ng/L (ref <18)

## 2024-07-20 MED ORDER — GUAIFENESIN-DM 100-10 MG/5ML PO SYRP
5.0000 mL | ORAL_SOLUTION | Freq: Three times a day (TID) | ORAL | 0 refills | Status: DC | PRN
  Filled 2024-07-20: qty 118, 8d supply, fill #0

## 2024-07-20 MED ORDER — GUAIFENESIN-DM 100-10 MG/5ML PO SYRP: 5.0000 mL | ORAL_SOLUTION | Freq: Three times a day (TID) | ORAL | 0 refills | Status: AC | PRN

## 2024-07-20 MED ORDER — SODIUM CHLORIDE 0.9 % IV BOLUS
500.0000 mL | Freq: Once | INTRAVENOUS | Status: AC
  Administered 2024-07-20: 500 mL via INTRAVENOUS

## 2024-07-20 NOTE — ED Notes (Signed)
 Got patient into a gown on the monitor patient is resting with call bell in reach

## 2024-07-20 NOTE — ED Notes (Signed)
MD bedside to speak with patient 

## 2024-07-20 NOTE — Discharge Instructions (Signed)
 Their workup was reassuring today.  No pneumonia was seen.  Likely viral.  Follow-up with your doctor as needed.  Motrin  and Tylenol  may help with the pains.

## 2024-07-20 NOTE — ED Triage Notes (Signed)
 C/O SHOB, CP that radiates to back, diaphoresis, and body aches that started yesterday afternoon. Axox4. C/O coughing/n/v.

## 2024-07-20 NOTE — ED Notes (Signed)
 Patient transported to X-ray

## 2024-07-23 NOTE — ED Provider Notes (Signed)
 [place] EMERGENCY DEPARTMENT AT [facility] Provider Note   CSN: [id number] Arrival date & time: 07/20/24  1233     Patient presents with: Chest Pain   Oscar Acosta is a 43 y.o. male.    Chest Pain Patient presents with chest pain.  Shortness of breath.  Cough.  Had body aches.  Started yesterday.  Has had myalgias.  Occasional mild sputum production.  No fevers.  No definite sick contacts.  Not exertional pain.    History reviewed. No pertinent past medical history.  Prior to Admission medications   Medication Sig Start Date End Date Taking? Authorizing Provider  albuterol  (VENTOLIN  HFA) 108 (90 Base) MCG/ACT inhaler Inhale 2 puffs into the lungs every 4 (four) hours as needed for wheezing or shortness of breath. 01/29/22   Pollina, Lonni PARAS, MD  amoxicillin -clavulanate (AUGMENTIN ) 875-125 MG tablet Take 1 tablet by mouth every 12 (twelve) hours. 05/15/24   Emil Share, DO  benzonatate  (TESSALON ) 200 MG capsule Take 1 capsule (200 mg total) by mouth 3 (three) times daily as needed for cough. 01/29/22   Haze Lonni PARAS, MD  cyclobenzaprine  (FLEXERIL ) 5 MG tablet Take 1 tablet (5 mg total) by mouth 3 (three) times daily as needed for muscle spasms. 09/15/21   Terance Levada BRAVO, MD  dicyclomine  (BENTYL ) 20 MG tablet Take 1 tablet (20 mg total) by mouth 2 (two) times daily. 05/15/24   Emil Share, DO  guaiFENesin-dextromethorphan (ROBITUSSIN DM) 100-10 MG/5ML syrup Take 5 mLs by mouth 3 (three) times daily as needed for cough. 07/20/24   Patsey Lot, MD  naproxen  (NAPROSYN ) 500 MG tablet Take 1 tablet (500 mg total) by mouth 2 (two) times daily as needed for moderate pain. 12/17/21   Petrucelli, Samantha R, PA-C  oxyCODONE  (OXY IR/ROXICODONE ) 5 MG immediate release tablet Take 1 tablet (5 mg total) by mouth every 6 (six) hours as needed for severe pain. 12/25/22   Simaan, Elizabeth S, PA-C  predniSONE  (DELTASONE ) 20 MG tablet Take 2 tablets (40 mg total) by  mouth daily with breakfast. 01/29/22   Pollina, Lonni PARAS, MD    Allergies: Patient has no known allergies.    Review of Systems  Cardiovascular:  Positive for chest pain.    Updated Vital Signs BP 117/79   Pulse 83   Temp 98.2 F (36.8 C) (Oral)   Resp 16   Ht 6' 1 (1.854 m)   Wt 90.7 kg   SpO2 100%   BMI 26.39 kg/m   Physical Exam Vitals and nursing note reviewed.  Constitutional:      Appearance: He is diaphoretic.  Cardiovascular:     Rate and Rhythm: Regular rhythm.  Pulmonary:     Breath sounds: No decreased breath sounds or wheezing.  Chest:     Chest wall: No tenderness.  Abdominal:     Tenderness: There is no abdominal tenderness.  Musculoskeletal:     Right lower leg: No edema.     Left lower leg: No edema.  Neurological:     Mental Status: He is alert.     (all labs ordered are listed, but only abnormal results are displayed) Labs Reviewed  BASIC METABOLIC PANEL WITH GFR - Abnormal; Notable for the following components:      Result Value   Potassium 3.2 (*)    Glucose, Bld 100 (*)    All other components within normal limits  RESP PANEL BY RT-PCR (RSV, FLU A&B, COVID)  [id number]  CBC  TROPONIN I (  HIGH SENSITIVITY)    EKG: EKG Interpretation Date/Time:  Thursday July 20 2024 12:41:33 EDT Ventricular Rate:  107 PR Interval:  116 QRS Duration:  90 QT Interval:  338 QTC Calculation: 451 R Axis:   85  Text Interpretation: Sinus tachycardia Right atrial enlargement Left ventricular hypertrophy with repolarization abnormality ( Sokolow-Lyon , Romhilt-Estes ) Abnormal ECG When compared with ECG of 19-Jun-2019 21:12,  rate is increased Confirmed by Patsey Lot [phone]) on 07/20/2024 1:01:28 PM  Radiology: No results found.   Procedures   Medications Ordered in the ED  sodium chloride  0.9 % bolus 500 mL (0 mLs Intravenous Stopped 07/20/24 1421)                                    Medical Decision Making Amount and/or Complexity  of Data Reviewed Labs: ordered. Radiology: ordered.  Risk OTC drugs.   Patient with URI symptoms cough.  EKG reassuring.  Doubt cardiac cause.  X-ray does not show pneumonia.  Feeling better and vitals have normalized.  Initial tachycardia resolved.  Most likely URI symptoms.  Doubt cardiac ischemia.  Will discharge home.     Final diagnoses:  Upper respiratory tract infection, unspecified type    ED Discharge Orders          Ordered    guaiFENesin-dextromethorphan (ROBITUSSIN DM) 100-10 MG/5ML syrup  3 times daily PRN,   Status:  Discontinued        07/20/24 1545    guaiFENesin-dextromethorphan (ROBITUSSIN DM) 100-10 MG/5ML syrup  3 times daily PRN        07/20/24 1555               Patsey Lot, MD 07/23/24 [phone]
